# Patient Record
Sex: Male | Born: 1990 | Race: White | Hispanic: No | Marital: Married | Smoking: Former smoker
Health system: Southern US, Community
[De-identification: ages and names within clinical notes are randomized; demographics above are authoritative.]

## PROBLEM LIST (undated history)

## (undated) DIAGNOSIS — I10 Essential (primary) hypertension: Secondary | ICD-10-CM

## (undated) DIAGNOSIS — E785 Hyperlipidemia, unspecified: Secondary | ICD-10-CM

## (undated) HISTORY — DX: Hyperlipidemia, unspecified: E78.5

---
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
## 2008-03-21 ENCOUNTER — Emergency Department (HOSPITAL_COMMUNITY): Admission: EM | Admit: 2008-03-21 | Discharge: 2008-03-21 | Payer: Self-pay | Admitting: Emergency Medicine

## 2008-05-10 ENCOUNTER — Emergency Department (HOSPITAL_COMMUNITY): Admission: EM | Admit: 2008-05-10 | Discharge: 2008-05-10 | Payer: Self-pay | Admitting: Emergency Medicine

## 2008-05-10 ENCOUNTER — Encounter: Payer: Self-pay | Admitting: Orthopedic Surgery

## 2008-05-13 ENCOUNTER — Ambulatory Visit: Payer: Self-pay | Admitting: Orthopedic Surgery

## 2008-05-13 DIAGNOSIS — S52209A Unspecified fracture of shaft of unspecified ulna, initial encounter for closed fracture: Secondary | ICD-10-CM | POA: Insufficient documentation

## 2008-05-27 ENCOUNTER — Emergency Department (HOSPITAL_COMMUNITY): Admission: EM | Admit: 2008-05-27 | Discharge: 2008-05-27 | Payer: Self-pay | Admitting: Emergency Medicine

## 2008-06-03 ENCOUNTER — Ambulatory Visit: Payer: Self-pay | Admitting: Orthopedic Surgery

## 2008-06-19 ENCOUNTER — Telehealth: Payer: Self-pay | Admitting: Orthopedic Surgery

## 2008-06-22 ENCOUNTER — Ambulatory Visit: Payer: Self-pay | Admitting: Orthopedic Surgery

## 2008-07-01 ENCOUNTER — Telehealth: Payer: Self-pay | Admitting: Orthopedic Surgery

## 2008-07-16 ENCOUNTER — Ambulatory Visit: Payer: Self-pay | Admitting: Orthopedic Surgery

## 2008-07-16 DIAGNOSIS — M25519 Pain in unspecified shoulder: Secondary | ICD-10-CM | POA: Insufficient documentation

## 2008-07-30 ENCOUNTER — Ambulatory Visit: Payer: Self-pay | Admitting: Orthopedic Surgery

## 2008-10-06 ENCOUNTER — Emergency Department (HOSPITAL_COMMUNITY): Admission: EM | Admit: 2008-10-06 | Discharge: 2008-10-06 | Payer: Self-pay | Admitting: Emergency Medicine

## 2008-10-07 ENCOUNTER — Emergency Department (HOSPITAL_COMMUNITY): Admission: EM | Admit: 2008-10-07 | Discharge: 2008-10-07 | Payer: Self-pay | Admitting: Emergency Medicine

## 2008-10-09 ENCOUNTER — Encounter (HOSPITAL_COMMUNITY): Admission: RE | Admit: 2008-10-09 | Discharge: 2008-11-08 | Payer: Self-pay | Admitting: Emergency Medicine

## 2010-05-23 ENCOUNTER — Emergency Department (HOSPITAL_COMMUNITY): Admission: EM | Admit: 2010-05-23 | Discharge: 2010-05-23 | Payer: Self-pay | Admitting: Emergency Medicine

## 2010-07-06 ENCOUNTER — Emergency Department (HOSPITAL_COMMUNITY): Admission: EM | Admit: 2010-07-06 | Discharge: 2010-07-06 | Payer: Self-pay | Admitting: Emergency Medicine

## 2010-08-03 ENCOUNTER — Emergency Department (HOSPITAL_COMMUNITY): Admission: EM | Admit: 2010-08-03 | Discharge: 2010-08-03 | Payer: Self-pay | Admitting: Emergency Medicine

## 2010-09-16 ENCOUNTER — Emergency Department (HOSPITAL_COMMUNITY): Admission: EM | Admit: 2010-09-16 | Discharge: 2010-09-16 | Payer: Self-pay | Admitting: Emergency Medicine
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
## 2017-12-07 ENCOUNTER — Emergency Department (HOSPITAL_COMMUNITY)
Admission: EM | Admit: 2017-12-07 | Discharge: 2017-12-07 | Disposition: A | Discharge status: Home / Self Care | Payer: Self-pay | Attending: Emergency Medicine | Admitting: Emergency Medicine

## 2017-12-07 ENCOUNTER — Other Ambulatory Visit: Payer: Self-pay

## 2017-12-07 ENCOUNTER — Encounter (HOSPITAL_COMMUNITY): Payer: Self-pay | Admitting: Emergency Medicine

## 2017-12-07 DIAGNOSIS — X58XXXA Exposure to other specified factors, initial encounter: Secondary | ICD-10-CM | POA: Insufficient documentation

## 2017-12-07 DIAGNOSIS — Y999 Unspecified external cause status: Secondary | ICD-10-CM | POA: Insufficient documentation

## 2017-12-07 DIAGNOSIS — Y939 Activity, unspecified: Secondary | ICD-10-CM | POA: Insufficient documentation

## 2017-12-07 DIAGNOSIS — Y929 Unspecified place or not applicable: Secondary | ICD-10-CM | POA: Insufficient documentation

## 2017-12-07 DIAGNOSIS — T1502XA Foreign body in cornea, left eye, initial encounter: Secondary | ICD-10-CM | POA: Insufficient documentation

## 2017-12-07 DIAGNOSIS — H18892 Other specified disorders of cornea, left eye: Secondary | ICD-10-CM

## 2017-12-07 MED ORDER — ERYTHROMYCIN 5 MG/GM OP OINT
TOPICAL_OINTMENT | Freq: Once | OPHTHALMIC | Status: AC
  Administered 2017-12-07: 1 via OPHTHALMIC
  Filled 2017-12-07: qty 3.5

## 2017-12-07 MED ORDER — ERYTHROMYCIN 5 MG/GM OP OINT
TOPICAL_OINTMENT | OPHTHALMIC | 0 refills | Status: DC
Start: 2017-12-07 — End: 2023-01-05

## 2017-12-07 MED ORDER — TETRACAINE HCL 0.5 % OP SOLN
OPHTHALMIC | Status: AC
  Filled 2017-12-07: qty 4

## 2017-12-07 MED ORDER — OXYCODONE-ACETAMINOPHEN 5-325 MG PO TABS: 1.0000 | ORAL_TABLET | ORAL | 0 refills | Status: DC | PRN

## 2017-12-07 MED ORDER — FLUORESCEIN SODIUM 1 MG OP STRP
ORAL_STRIP | OPHTHALMIC | Status: AC
  Filled 2017-12-07: qty 1

## 2017-12-07 NOTE — ED Triage Notes (Signed)
Pt states he woke up tonight and felt like there was "a thorn" in my eye.

## 2017-12-07 NOTE — Discharge Instructions (Signed)
You had a piece of metal in your eye.  Unfortunately, when it was removed, some rust had bleached into your cornea.  You need to follow-up with the ophthalmologist to have the rust removed, or it will permanently stain your eye.

## 2017-12-07 NOTE — ED Notes (Signed)
Pt ambulatory to waiting room. Pt verbalized understanding of discharge instructions.   

## 2017-12-07 NOTE — ED Provider Notes (Signed)
[facility] EMERGENCY DEPARTMENT Provider Note   CSN: [id number] Arrival date & time: 12/07/17  0255     History   Chief Complaint Chief Complaint  Patient presents with  . Eye injury    HPI [name] is a 27 y.o. male.  The history is provided by the patient.  He complains of a foreign body sensation in his left eye.  He has been working doing grinding and welding.  When he stepped outside into the light, he suddenly had severe pain in his left eye as if there was a thorn in it.  He was unable to sleep because of pain.  He denies any other injury.  No past medical history on file.  Patient Active Problem List   Diagnosis Date Noted  . SHOULDER PAIN, LEFT 07/16/2008  . CLOSED FRACTURE OF SHAFT OF ULNA 05/13/2008    History reviewed. No pertinent surgical history.     Home Medications    Prior to Admission medications   Not on File    Family History No family history on file.  Social History Social History   Tobacco Use  . Smoking status: Not on file  Substance Use Topics  . Alcohol use: Not on file  . Drug use: Not on file     Allergies   Patient has no allergy information on record.   Review of Systems Review of Systems  All other systems reviewed and are negative.    Physical Exam Updated Vital Signs BP 140/90   Pulse (!) 51   Resp 18   Wt 97.5 kg (215 lb)   SpO2 99%   Physical Exam  Nursing note and vitals reviewed.  27 year old male, resting comfortably and in no acute distress. Vital signs are significant for bradycardia and borderline blood pressure. Oxygen saturation is 99%, which is normal. Head is normocephalic and atraumatic. PERRLA, EOMI. Oropharynx is clear.  Mild edema noted of the lower lid of the left eye.  There is moderate erythema of the conjunctiva of the left eye.  No foreign body seen and no conjunctival foreign body seen.  Upper and lower conjunctival sacs were inspected after eversion of lids with no foreign body  seen.  Anterior chamber appears clear. Neck is nontender and supple without adenopathy or JVD.  Back is nontender and there is no CVA tenderness. Lungs are clear without rales, wheezes, or rhonchi. Chest is nontender. Heart has regular rate and rhythm without murmur. Abdomen is soft, flat, nontender without masses or hepatosplenomegaly and peristalsis is normoactive. Extremities have no cyanosis or edema, full range of motion is present. Skin is warm and dry without rash. Neurologic: Mental status is normal, cranial nerves are intact, there are no motor or sensory deficits.  ED Treatments / Results   Procedures .Foreign Body Removal Date/Time: 12/07/2017 3:15 AM Performed by: Dione BoozeGlick, Jeslyn Amsler, MD Authorized by: Dione BoozeGlick, Mabel Roll, MD  Consent: Verbal consent obtained. Written consent not obtained. Risks and benefits: risks, benefits and alternatives were discussed Consent given by: patient Patient understanding: patient states understanding of the procedure being performed Patient consent: the patient's understanding of the procedure matches consent given Procedure consent: procedure consent matches procedure scheduled Relevant documents: relevant documents present and verified Site marked: the operative site was marked Required items: required blood products, implants, devices, and special equipment available Patient identity confirmed: verbally with patient and arm band Time out: Immediately prior to procedure a "time out" was called to verify the correct patient, procedure, equipment,  support staff and site/side marked as required. Body area: eye Location details: left cornea  Anesthesia: Local Anesthetic: tetracaine drops  Sedation: Patient sedated: no  Patient restrained: no Patient cooperative: yes Localization method: slit lamp Removal mechanism: 18 guage needle. Eye not examined with fluorescein. Residual rust ring present. Dressing: antibiotic ointment Depth:  embedded Complexity: simple 1 objects recovered. Objects recovered: piece of metal Post-procedure assessment: foreign body removed Patient tolerance: Patient tolerated the procedure well with no immediate complications    Slit lamp exam: Foreign body seen in the central cornea of the left eye.  Anterior chamber clear.  No other corneal injury seen.  Foreign body removed under direct vision with slit-lamp and residual rust ring noted.  Medications Ordered in ED Medications  fluorescein 1 MG ophthalmic strip (not administered)  tetracaine (PONTOCAINE) 0.5 % ophthalmic solution (not administered)  erythromycin ophthalmic ointment (not administered)     Initial Impression / Assessment and Plan / ED Course  I have reviewed the triage vital signs and the nursing notes.  Foreign body the cornea of the left eye with residual rust ring present after removal of foreign body.  He is discharged with prescriptions for erythromycin ophthalmic ointment and oxycodone-acetaminophen.  Referred to ophthalmology for removal of rust ring.  Final Clinical Impressions(s) / ED Diagnoses   Final diagnoses:  Foreign body in cornea, left eye, initial encounter  Corneal rust ring of left eye    ED Discharge Orders    None       Dione Booze, MD 12/07/17 [phone]

## 2017-12-10 MED FILL — Oxycodone w/ Acetaminophen Tab 5-325 MG: ORAL | Qty: 6 | Status: AC

## 2018-05-16 ENCOUNTER — Other Ambulatory Visit: Payer: Self-pay

## 2018-05-16 ENCOUNTER — Encounter (HOSPITAL_COMMUNITY): Payer: Self-pay

## 2018-05-16 ENCOUNTER — Emergency Department (HOSPITAL_COMMUNITY): Payer: Self-pay

## 2018-05-16 ENCOUNTER — Emergency Department (HOSPITAL_COMMUNITY)
Admission: EM | Admit: 2018-05-16 | Discharge: 2018-05-16 | Disposition: A | Discharge status: Home / Self Care | Payer: Self-pay | Attending: Emergency Medicine | Admitting: Emergency Medicine

## 2018-05-16 DIAGNOSIS — M5432 Sciatica, left side: Secondary | ICD-10-CM | POA: Insufficient documentation

## 2018-05-16 DIAGNOSIS — M5489 Other dorsalgia: Secondary | ICD-10-CM

## 2018-05-16 MED ORDER — METHYLPREDNISOLONE 4 MG PO TBPK: ORAL_TABLET | ORAL | 0 refills | Status: DC

## 2018-05-16 MED ORDER — DEXAMETHASONE SODIUM PHOSPHATE 10 MG/ML IJ SOLN
10.0000 mg | Freq: Once | INTRAMUSCULAR | Status: AC
Start: 2018-05-16 — End: 2018-05-16
  Administered 2018-05-16: 10 mg via INTRAMUSCULAR
  Filled 2018-05-16: qty 1

## 2018-05-16 MED ORDER — KETOROLAC TROMETHAMINE 30 MG/ML IJ SOLN
30.0000 mg | Freq: Once | INTRAMUSCULAR | Status: AC
Start: 2018-05-16 — End: 2018-05-16
  Administered 2018-05-16: 30 mg via INTRAMUSCULAR
  Filled 2018-05-16: qty 1

## 2018-05-16 MED ORDER — METHOCARBAMOL 500 MG PO TABS: 500.0000 mg | ORAL_TABLET | Freq: Two times a day (BID) | ORAL | 0 refills | Status: DC

## 2018-05-16 MED ORDER — NAPROXEN 500 MG PO TABS: 500.0000 mg | ORAL_TABLET | Freq: Two times a day (BID) | ORAL | 0 refills | Status: DC
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
## 2018-05-16 NOTE — Discharge Instructions (Signed)
X-ray looks good today.  Your symptoms are likely due to sciatic nerve inflammation.  Please take course of steroids as directed, afterwards you may begin taking naproxen twice daily, and Tylenol as needed for breakthrough pain.  You may use Robaxin to help ease muscle spasm, this can cause drowsiness do not take before driving or going to work.  You may also use over-the-counter Salon pas lidocaine patches.  These follow-up with your primary care doctor and/or orthopedics for further evaluation if symptoms are not improving.  Take it easy over the next few days with light stretching and use the exercises provided.  Return to the emergency department if you develop significantly worsening pain, numbness or weakness in the legs, loss of bowel or bladder control, fevers or any other new or concerning symptoms.

## 2018-08-19 IMAGING — DX DG LUMBAR SPINE COMPLETE 4+V
5 series · 5 of 5 positions shown · non-contrast
Comparison: 05/09/2006

CLINICAL DATA: Low back pain with radiation into the left hip.

EXAM:
LUMBAR SPINE - COMPLETE 4+ VIEW

[l-spine ap]
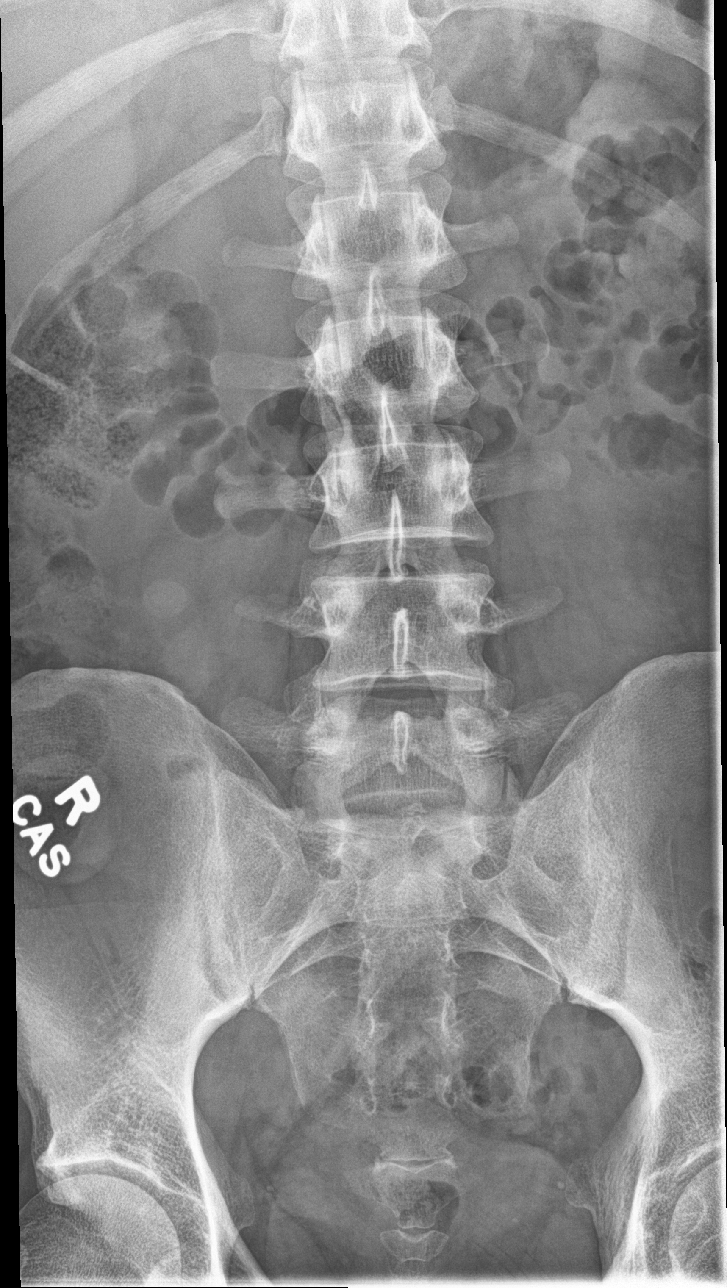

[l-spine obl (1 of 2)]
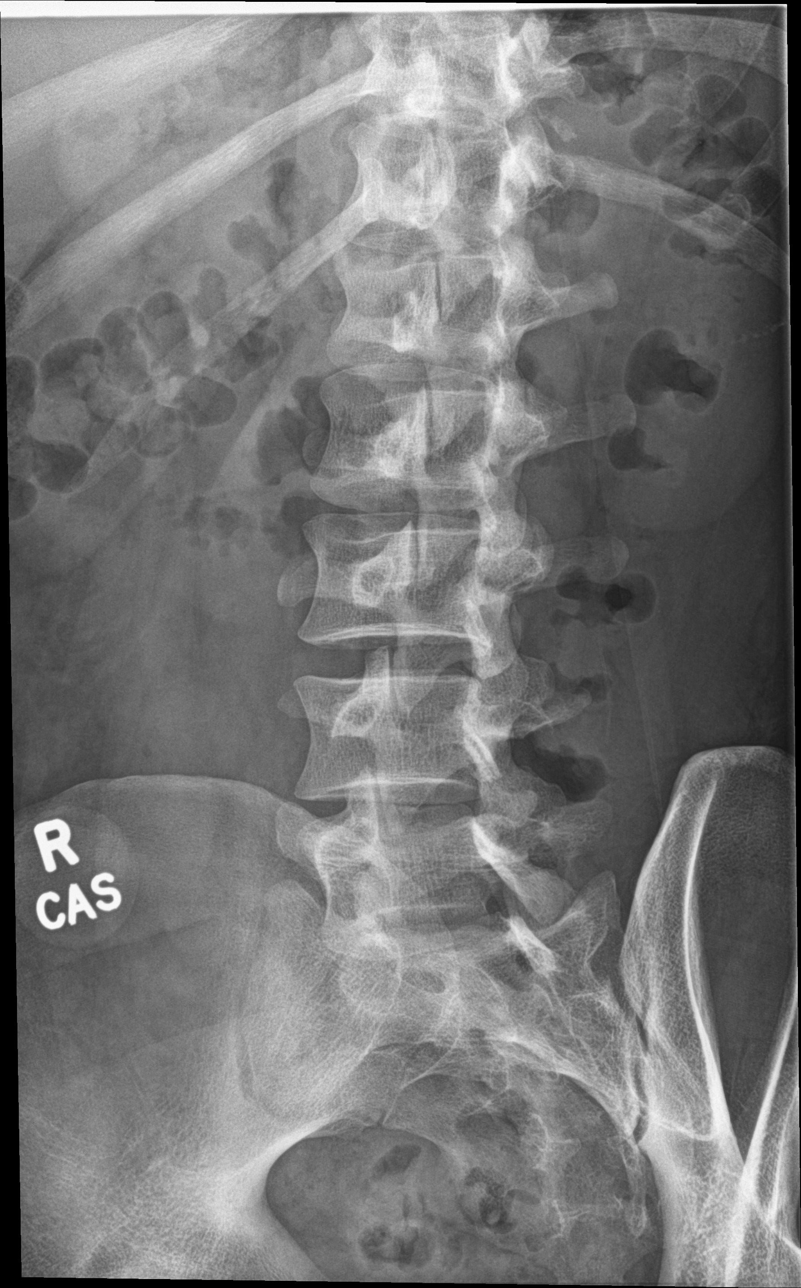

[l-spine obl (2 of 2)]
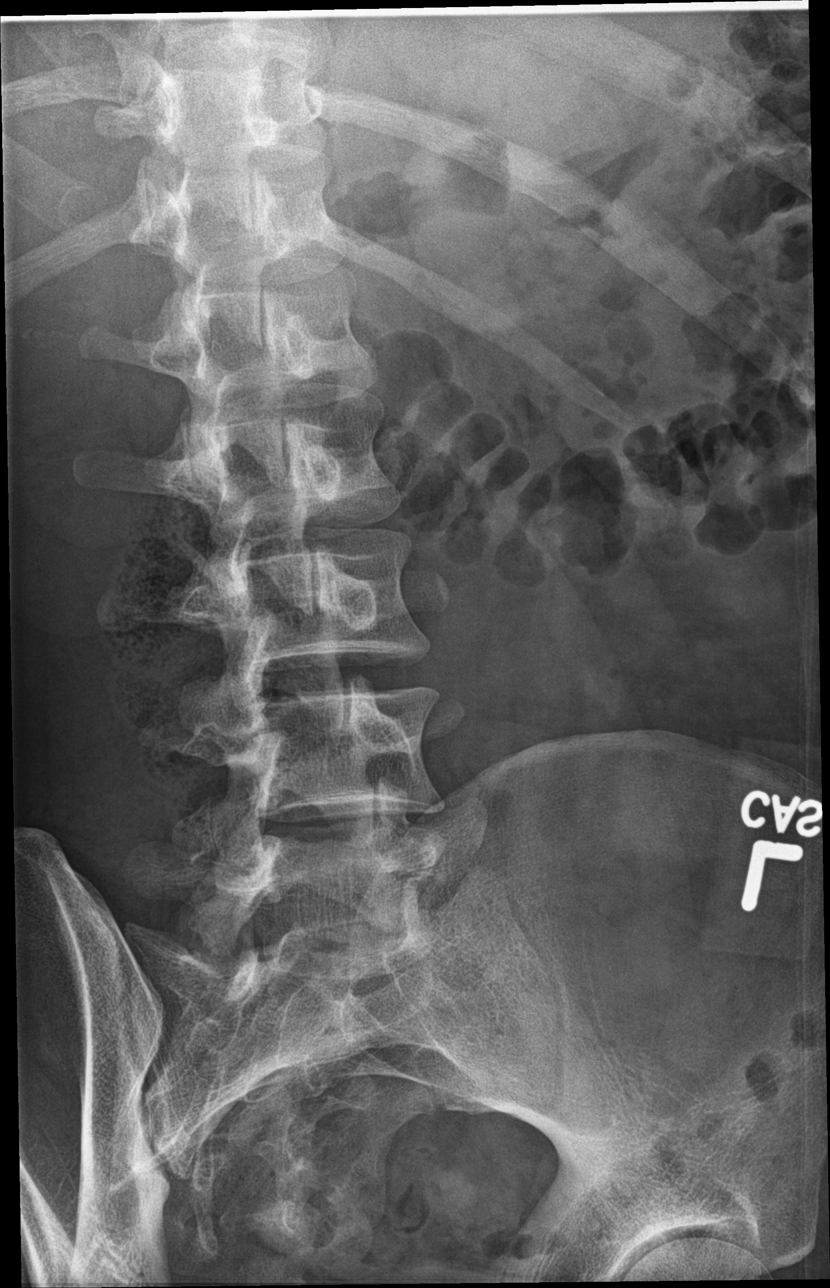

[l-spine lat]
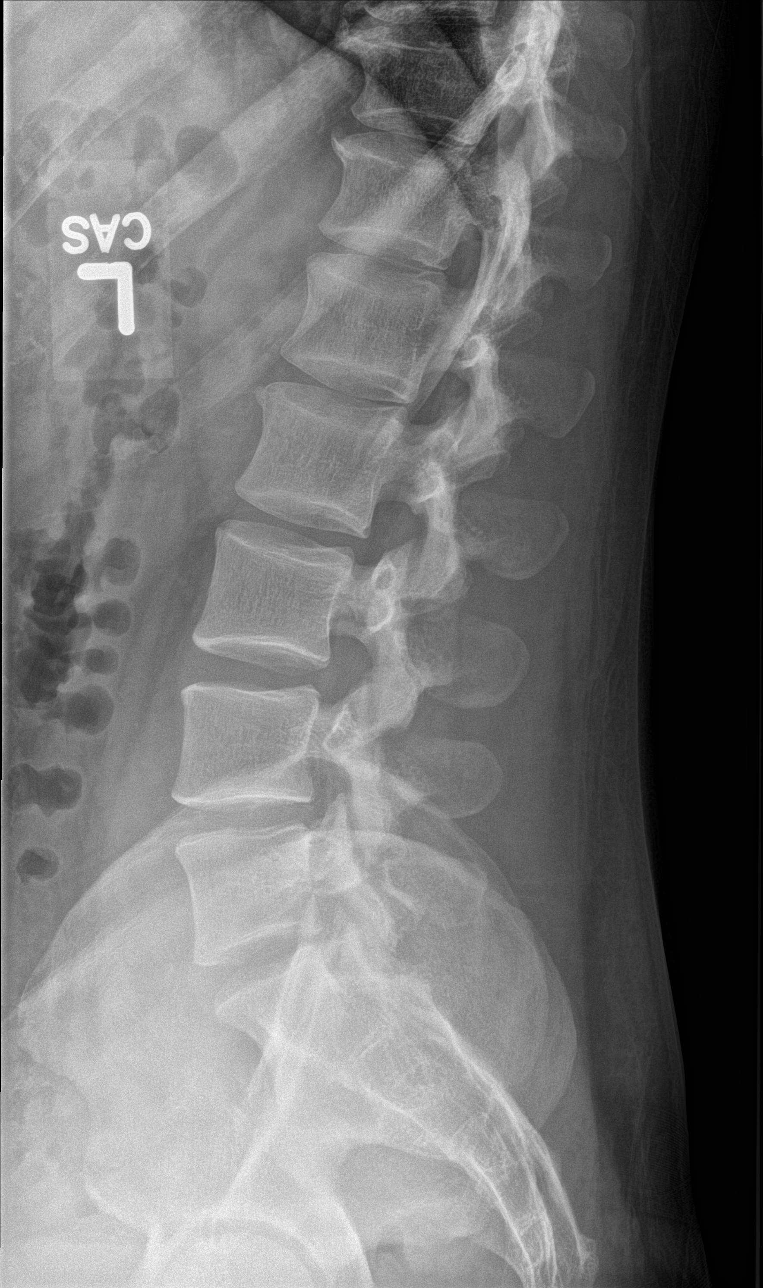

[l-spine spot]
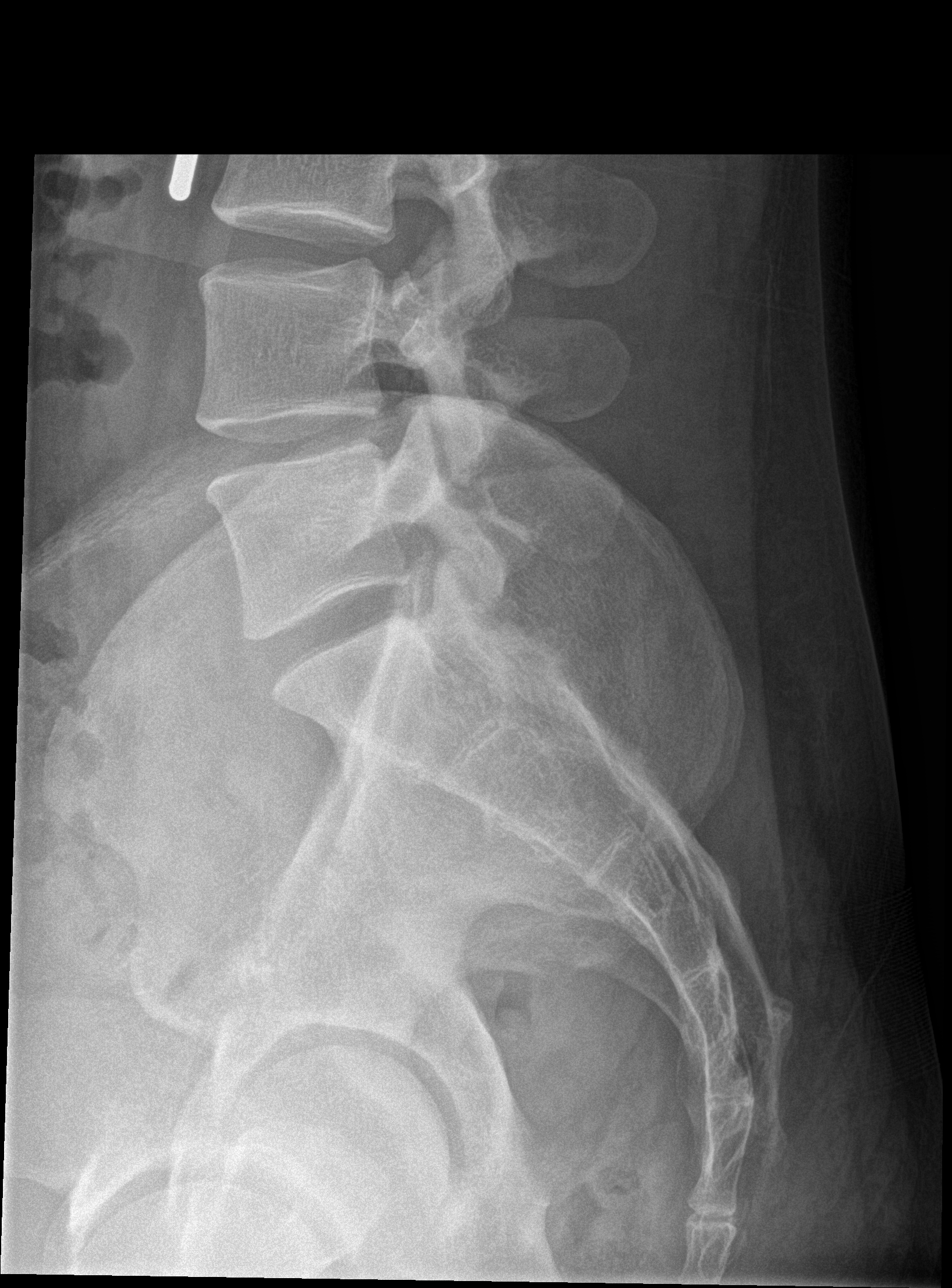

[5 of 5 positions shown; findings below may reference images not displayed]

FINDINGS: Five lumbar type vertebral bodies are well visualized. Vertebral
body height is well maintained. No pars defects are seen. No
anterolisthesis is noted. No soft tissue changes are seen.
IMPRESSION: No acute abnormality noted.

## 2022-12-22 ENCOUNTER — Other Ambulatory Visit: Payer: Self-pay

## 2022-12-22 ENCOUNTER — Emergency Department (HOSPITAL_COMMUNITY): Payer: Managed Care, Other (non HMO)

## 2022-12-22 ENCOUNTER — Emergency Department (HOSPITAL_COMMUNITY)
Admission: EM | Admit: 2022-12-22 | Discharge: 2022-12-22 | Disposition: A | Discharge status: Home / Self Care | Payer: Managed Care, Other (non HMO) | Attending: Emergency Medicine | Admitting: Emergency Medicine

## 2022-12-22 DIAGNOSIS — M778 Other enthesopathies, not elsewhere classified: Secondary | ICD-10-CM

## 2022-12-22 DIAGNOSIS — I10 Essential (primary) hypertension: Secondary | ICD-10-CM | POA: Diagnosis not present

## 2022-12-22 DIAGNOSIS — M7711 Lateral epicondylitis, right elbow: Secondary | ICD-10-CM | POA: Diagnosis not present

## 2022-12-22 DIAGNOSIS — M25521 Pain in right elbow: Secondary | ICD-10-CM | POA: Diagnosis present

## 2022-12-22 MED ORDER — DICLOFENAC SODIUM 75 MG PO TBEC
75.0000 mg | DELAYED_RELEASE_TABLET | Freq: Once | ORAL | Status: AC
  Administered 2022-12-22: 75 mg via ORAL
  Filled 2022-12-22: qty 1

## 2022-12-22 MED ORDER — DICLOFENAC SODIUM 75 MG PO TBEC: 75.0000 mg | DELAYED_RELEASE_TABLET | Freq: Two times a day (BID) | ORAL | 0 refills | Status: DC

## 2022-12-22 NOTE — Discharge Instructions (Signed)
Wear the brace on your elbow as needed for support when using.  Apply ice packs on and off to help with swelling.  Avoid excessive use at least for 1 week.  Also, your blood pressure today was elevated.  Please recheck your blood pressure at home or at the drugstore and document your readings.  I recommend that you follow-up with primary care if your blood pressure continues to remain elevated.  Return to the emergency department for any new or worsening symptoms.

## 2022-12-22 NOTE — ED Provider Notes (Signed)
[name] Note   CSN: [id number] Arrival date & time: 12/22/22  [id number]     History  Chief Complaint  Patient presents with   Elbow Pain    [name] is a 32 y.o. male.  HPI      [name] is a 32 y.o. male who presents to the Emergency Department complaining of right elbow pain gradually worsening for the last several months.  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  Feels that he has swelling of his elbow.  Pain is worse with movement and flexing of his forearm.  He occasionally has pins and needle sensation to his fingertips.  Denies any fever, chills, shoulder pain or weakness with his grip.    Home Medications Prior to Admission medications   Medication Sig Start Date End Date Taking? Authorizing Provider  diclofenac (VOLTAREN) 75 MG EC tablet Take 1 tablet (75 mg total) by mouth 2 (two) times daily. Take with food 12/22/22  Yes Demeco Ducksworth, PA-C  erythromycin ophthalmic ointment Place a 1/2 inch ribbon of ointment into the lower eyelid. [id number]   Delora Fuel, MD  methocarbamol (ROBAXIN) 500 MG tablet Take 1 tablet (500 mg total) by mouth 2 (two) times daily. 05/16/18   Jacqlyn Larsen, PA-C  methylPREDNISolone (MEDROL DOSEPAK) 4 MG TBPK tablet Take as directed 05/16/18   Jacqlyn Larsen, PA-C  oxyCODONE-acetaminophen (PERCOCET) 5-325 MG tablet Take 1 tablet by mouth every 4 (four) hours as needed for moderate pain. [id number]   Delora Fuel, MD  oxyCODONE-acetaminophen (PERCOCET) 5-325 MG tablet Take 1 tablet by mouth every 4 (four) hours as needed for moderate pain. [id number]   Delora Fuel, MD      Allergies    Patient has no known allergies.    Review of Systems   Review of Systems  Constitutional:  Negative for chills and fever.  Cardiovascular:  Negative for chest pain.  Gastrointestinal:  Negative for nausea and vomiting.  Musculoskeletal:  Positive for  arthralgias (Right elbow pain and swelling). Negative for neck pain and neck stiffness.  Skin:  Negative for color change, rash and wound.  Neurological:  Negative for weakness.    Physical Exam Updated Vital Signs BP (!) 150/109   Pulse 63   Temp 97.7 F (36.5 C)   Resp 17   SpO2 100%  Physical Exam Vitals and nursing note reviewed.  Constitutional:      General: He is not in acute distress.    Appearance: Normal appearance. He is not ill-appearing or toxic-appearing.  HENT:     Head: Atraumatic.  Cardiovascular:     Rate and Rhythm: Normal rate and regular rhythm.     Pulses: Normal pulses.  Pulmonary:     Effort: Pulmonary effort is normal.  Musculoskeletal:        General: Tenderness present. No swelling, deformity or signs of injury.     Right elbow: No swelling, deformity or effusion. Tenderness present in lateral epicondyle.     Comments: Focal tenderness to palpation over the lateral epicondyles.  I do not appreciate any bony tenderness.  Pain reproduced with supination and pronation.  No excessive warmth or erythema of the elbow.  Skin:    General: Skin is warm.     Capillary Refill: Capillary refill takes less than 2 seconds.     Findings: No bruising, erythema or rash.  Neurological:  General: No focal deficit present.     Mental Status: He is alert.     Sensory: No sensory deficit.     Motor: No weakness.     ED Results / Procedures / Treatments   Labs (all labs ordered are listed, but only abnormal results are displayed) Labs Reviewed - No data to display  EKG None  Radiology DG Elbow Complete Right  Result Date: 12/22/2022 CLINICAL DATA:  Right elbow pain for last few months denies any known injury patient also states there is fluid buildup on the right elbow. EXAM: RIGHT ELBOW - COMPLETE 3+ VIEW COMPARISON:  None Available. FINDINGS: There is no evidence of fracture or dislocation. Evaluation of radial head is suboptimal due to  technique/positioning. There is no evidence of arthropathy or other focal bone abnormality. Evaluation of soft tissues is limited on radiographs. IMPRESSION: No acute fracture or dislocation. Evaluation of radial head is suboptimal due to technique/positioning. Further evaluation with CT or MR examination is suggested if clinically warranted. Electronically Signed   By: Keane Police D.O.   On: 12/22/2022 09:30    Procedures Procedures    Medications Ordered in ED Medications  diclofenac (VOLTAREN) EC tablet 75 mg (75 mg Oral Given 12/22/22 0957)    ED Course/ Medical Decision Making/ A&P                             Medical Decision Making Patient here with persistent pain of his right elbow, worse with use.  Feels that the area is swollen.  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  Intermittent paresthesias of his fingers no shoulder pain   Clinically, suspect this is related to tendinitis.  He has focal tenderness over the lateral epicondyle.  Do not appreciate any effusion excessive warmth or erythema to suggest a septic joint.  He has full range of motion of the elbow with pain reproduced with pronation and supination.  Amount and/or Complexity of Data Reviewed Radiology: ordered.    Details: Plain film x-ray of the elbow without evidence of fracture or dislocation Discussion of management or test interpretation with external provider(s): Discussed findings with patient, suspect this is inflammatory process.  Low clinical suspicion for fracture or dislocation.  Neurovascularly intact.  No concern for septic joint.  Patient has elbow brace at home, prescription written for anti-inflammatory, agrees to RICE therapy and close outpatient follow-up with orthopedics.  His blood pressure is elevated no history of hypertension.  I have discussed this in detail with him and recommended that he establish primary care for further evaluation of his blood pressure and he agrees to  monitor his blood pressure at home.  Risk Prescription drug management.           Final Clinical Impression(s) / ED Diagnoses Final diagnoses:  Tendinitis of elbow  Hypertension, unspecified type    Rx / DC Orders ED Discharge Orders          Ordered    diclofenac (VOLTAREN) 75 MG EC tablet  2 times daily        12/22/22 [address] [place], PA-C 12/22/22 1031    Milton Ferguson, MD 12/23/22 0745

## 2022-12-22 NOTE — ED Triage Notes (Signed)
Pt here with R sided elbow pain for the last few months. Denies known injury. Pt also states there is fluid buildup on that elbow.

## 2023-01-05 ENCOUNTER — Encounter: Payer: Self-pay | Admitting: Orthopedic Surgery

## 2023-01-05 ENCOUNTER — Ambulatory Visit: Payer: Managed Care, Other (non HMO) | Admitting: Orthopedic Surgery

## 2023-01-05 VITALS — BP 141/95 | HR 58 | Ht 71.0 in | Wt 236.0 lb

## 2023-01-05 DIAGNOSIS — M7711 Lateral epicondylitis, right elbow: Secondary | ICD-10-CM

## 2023-01-05 DIAGNOSIS — M25521 Pain in right elbow: Secondary | ICD-10-CM

## 2023-01-05 MED ORDER — METHYLPREDNISOLONE ACETATE 40 MG/ML IJ SUSP
40.0000 mg | Freq: Once | INTRAMUSCULAR | Status: AC
  Administered 2023-01-05: 40 mg via INTRA_ARTICULAR

## 2023-01-05 NOTE — Patient Instructions (Addendum)
Letter for work - out for 1 week  Instructions Following Joint Injections  In clinic today, you received an injection in one of your joints (sometimes more than one).  Occasionally, you can have some pain at the injection site, this is normal.  You can place ice at the injection site, or take over-the-counter medications such as Tylenol (acetaminophen) or Advil (ibuprofen).  Please follow all directions listed on the bottle.  If your joint (knee or shoulder) becomes swollen, red or very painful, please contact the clinic for additional assistance.   Two medications were injected, including lidocaine and a steroid (often referred to as cortisone).  Lidocaine is effective almost immediately but wears off quickly.  However, the steroid can take a few days to improve your symptoms.  In some cases, it can make your pain worse for a couple of days.  Do not be concerned if this happens as it is common.  You can apply ice or take some over-the-counter medications as needed.      [facility] Elbow Rehab Do exercises exactly as told by your health care provider and adjust them as directed. It is normal to feel mild stretching, pulling, tightness, or discomfort as you do these exercises. Stop right away if you feel sudden pain or your pain gets worse.   Stretching and range-of-motion exercises These exercises warm up your muscles and joints and improve the movement andflexibility of your elbow. Wrist flexion, assisted  Straighten your left / right elbow in front of you with your palm facing down toward the floor. If told by your health care provider, bend your left / right elbow to a 90-degree angle (right angle) at your side instead of holding it straight. With your other hand, gently push over the back of your left / right hand so your fingers point toward the floor (flexion). Stop when you feel a gentle stretch on the back of your forearm. Hold this position for 10 seconds. Repeat 10 times. Complete  this exercise 1-2 times a day. Wrist extension, assisted  Straighten your left / right elbow in front of you with your palm facing up toward the ceiling. If told by your health care provider, bend your left / right elbow to a 90-degree angle (right angle) at your side instead of holding it straight. With your other hand, gently pull your left / right hand and fingers toward the floor (extension). Stop when you feel a gentle stretch on the palm side of your forearm. Hold this position for 10 seconds. Repeat 10 times. Complete this exercise 1-2 times a day. Assisted forearm rotation, supination Sit or stand with your elbows at your side. Bend your left / right elbow to a 90-degree angle (right angle). Using your uninjured hand, turn your left / right palm up toward the ceiling (supination) until you feel a gentle stretch along the inside of your forearm. Hold this position for 10 seconds. Repeat 10 times. Complete this exercise 1-2 times a day. Assisted forearm rotation, pronation Sit or stand with your elbows at your side. Bend your left / right elbow to a 90-degree angle (right angle). Using your uninjured hand, turn your left / right palm down toward the floor (pronation) until you feel a gentle stretch along the outside of your forearm. Hold this position for 10 seconds. Repeat 10 times. Complete this exercise 1-2 times a day. Strengthening exercises These exercises build strength and endurance in your forearm and elbow. Endurance is the ability to use your muscles  for a long time, even after theyget tired. Radial deviation  Stand with a 5 lbs weight or a hammer in your left / right hand. Or, sit while holding a rubber exercise band or tubing, with your left / right forearm supported on a table or countertop. Position your forearm so that the thumb is facing the ceiling, as if you are going to clap your hands. This is the neutral position. Raise your hand upward in front of you so your  thumb moves toward the ceiling (radial deviation), or pull up on the rubber tubing. Keep your forearm and elbow still while you move your wrist only. Hold this position for 10 seconds. Slowly return to the starting position. Repeat 10 times. Complete this exercise 1-2 times a day. Wrist extension, eccentric Sit with your left / right forearm palm-down and supported on a table or other surface. Let your left / right wrist extend over the edge of the surface. Hold a 5 lbs (can of soup) weight or a piece of exercise band or tubing in your left / right hand. If using a rubber exercise band or tubing, hold the other end of the tubing with your other hand. Use your uninjured hand to move your left / right hand up toward the ceiling. Take your uninjured hand away and slowly return to the starting position using only your left / right hand. Lowering your arm under tension is called eccentric extension. Repeat 10 times. Complete this exercise 1-2 times a day. Wrist extension  Do not do this exercise if it causes pain at the outside of your elbow. Only do this exercise once instructed by your health care provider. Sit with your left / right forearm supported on a table or other surface and your palm turned down toward the floor. Let your left / right wrist extend over the edge of the surface. Hold a 5 lbs weight or a piece of rubber exercise band or tubing. If you are using a rubber exercise band or tubing, hold the band or tubing in place with your other hand to provide resistance. Slowly bend your wrist so your hand moves up toward the ceiling (extension). Move only your wrist, keeping your forearm and elbow still. Hold this position for 10 seconds. Slowly return to the starting position. Repeat 10 times. Complete this exercise 1-2 times a day. Forearm rotation, supination To do this exercise, you will need a lightweight hammer or rubber mallet. Sit with your left / right forearm supported on a table  or other surface. Bend your elbow to a 90-degree angle (right angle). Position your forearm so that your palm is facing down toward the floor, with your hand resting over the edge of the table. Hold a hammer in your left / right hand. To make this exercise easier, hold the hammer near the head of the hammer. To make this exercise harder, hold the hammer near the end of the handle. Without moving your wrist or elbow, slowly rotate your forearm so your palm faces up toward the ceiling (supination). Hold this position for 10 seconds. Slowly return to the starting position. Repeat 10 times. Complete this exercise 1-2 times a day. Shoulder blade squeeze Sit in a stable chair or stand with good posture. If you are sitting down, do not let your back touch the back of the chair. Your arms should be at your sides with your elbows bent to a 90-degree angle (right angle). Position your forearms so that your thumbs are  facing the ceiling (neutral position). Without lifting your shoulders up, squeeze your shoulder blades tightly together. Hold this position for 10 seconds. Slowly release and return to the starting position. Repeat 10 times. Complete this exercise 1-2 times a day. This information is not intended to replace advice given to you by your health care provider. Make sure you discuss any questions you have with your healthcare provider. Document Revised: 12/31/2019 Document Reviewed: 12/31/2019 Elsevier Patient Education  [employer].

## 2023-01-06 ENCOUNTER — Encounter: Payer: Self-pay | Admitting: Orthopedic Surgery

## 2023-01-06 NOTE — Progress Notes (Signed)
New Patient Visit  Assessment: [name] is a 32 y.o. male with the following: 1. Right lateral epicondylitis  Plan: [name] has pain over the right lateral elbow.  This has been progressively worsening for several months.  Activities at work make his pain worse.  He has completed a course of prednisone.  He is currently wearing a sleeve on his right elbow.  He is unable to get sustained relief.  His presentation is consistent with right lateral epicondylitis.  We discussed multiple treatment options.  He elected to proceed with an injection.  He will continue to use his current brace.  I provided him with a work note, excusing him for the next week.  He will return to clinic as needed.  Procedure note injection - Right lateral elbow   Verbal consent was obtained to inject the right lateral elbow, lateral epicondyle Timeout was completed to confirm the site of injection.   The skin was prepped with alcohol and ethyl chloride was sprayed at the injection site.  A 21-gauge needle was used to inject 40 mg of Depo-Medrol and 1% lidocaine (1 cc) into the space around the lateral epicondyle using direct lateral approach.  There were no complications.  A sterile bandage was applied.    Follow-up: Return if symptoms worsen or fail to improve.  Subjective:  Chief Complaint  Patient presents with   New Patient (Initial Visit)    RT elbow pain Patient states he has hit his elbow several times. Just finished a prednisone dose pack.  LHD    History of Present Illness: [name] is a 32 y.o. male who presents for evaluation of right elbow pain.  He has had pain in the lateral elbow for couple of months.  No specific injury.  Pain continues to get worse.  He has tried medications, and a brace.  Pain gets worse at work.  He states he had similar type complaints in the left elbow, and this gradually improved.  He notes radiating pains distally from his elbow into his right  hand.   Review of Systems: No fevers or chills No numbness or tingling No chest pain No shortness of breath No bowel or bladder dysfunction No GI distress No headaches   Medical History:  History reviewed. No pertinent past medical history.  History reviewed. No pertinent surgical history.  History reviewed. No pertinent family history. Social History   Tobacco Use   Smoking status: Never   Smokeless tobacco: Never  Substance Use Topics   Alcohol use: No   Drug use: No    No Known Allergies  Current Meds  Medication Sig   diclofenac (VOLTAREN) 75 MG EC tablet Take 1 tablet (75 mg total) by mouth 2 (two) times daily. Take with food   [DISCONTINUED] erythromycin ophthalmic ointment Place a 1/2 inch ribbon of ointment into the lower eyelid.    Objective: BP (!) 141/95   Pulse (!) 58   Ht 5\' 11"  (1.803 m)   Wt 236 lb (107 kg)   BMI 32.92 kg/m   Physical Exam:  General: Alert and oriented. and No acute distress. Gait: Normal gait.  Evaluation of the right elbow demonstrates no deformity.  No redness.  No swelling.  No bruising.  He has tenderness to palpation in the area of the lateral epicondyle.  He has pain with resisted long finger extension.  He has pain with resisted extension of the right wrist.  Since the is intact distally.  All fingers are moving appropriately.  Active motion intact throughout the right hand.  IMAGING: I personally reviewed images previously obtained from the ED  X-rays were previously obtained of the right elbow.  No acute injuries are noted.  No dislocation.   New Medications:  Meds ordered this encounter  Medications   methylPREDNISolone acetate (DEPO-MEDROL) injection 40 mg      Mordecai Rasmussen, MD  01/06/2023 8:19 AM

## 2023-08-05 ENCOUNTER — Emergency Department (HOSPITAL_COMMUNITY): Payer: Managed Care, Other (non HMO)

## 2023-08-05 ENCOUNTER — Inpatient Hospital Stay (HOSPITAL_COMMUNITY)
Admission: EM | Admit: 2023-08-05 | Discharge: 2023-08-06 | DRG: 322 | Disposition: A | Discharge status: Home / Self Care | Payer: Managed Care, Other (non HMO) | Attending: Cardiology | Admitting: Cardiology

## 2023-08-05 ENCOUNTER — Other Ambulatory Visit: Payer: Self-pay

## 2023-08-05 ENCOUNTER — Encounter (HOSPITAL_COMMUNITY): Payer: Self-pay

## 2023-08-05 DIAGNOSIS — F17200 Nicotine dependence, unspecified, uncomplicated: Secondary | ICD-10-CM | POA: Diagnosis present

## 2023-08-05 DIAGNOSIS — F419 Anxiety disorder, unspecified: Secondary | ICD-10-CM | POA: Diagnosis present

## 2023-08-05 DIAGNOSIS — Z8249 Family history of ischemic heart disease and other diseases of the circulatory system: Secondary | ICD-10-CM

## 2023-08-05 DIAGNOSIS — R918 Other nonspecific abnormal finding of lung field: Secondary | ICD-10-CM | POA: Diagnosis present

## 2023-08-05 DIAGNOSIS — I214 Non-ST elevation (NSTEMI) myocardial infarction: Secondary | ICD-10-CM | POA: Diagnosis present

## 2023-08-05 DIAGNOSIS — R0683 Snoring: Secondary | ICD-10-CM | POA: Diagnosis present

## 2023-08-05 DIAGNOSIS — I10 Essential (primary) hypertension: Secondary | ICD-10-CM | POA: Diagnosis present

## 2023-08-05 DIAGNOSIS — K760 Fatty (change of) liver, not elsewhere classified: Secondary | ICD-10-CM | POA: Diagnosis present

## 2023-08-05 DIAGNOSIS — Z7982 Long term (current) use of aspirin: Secondary | ICD-10-CM

## 2023-08-05 DIAGNOSIS — Z79899 Other long term (current) drug therapy: Secondary | ICD-10-CM | POA: Diagnosis not present

## 2023-08-05 DIAGNOSIS — Z951 Presence of aortocoronary bypass graft: Secondary | ICD-10-CM

## 2023-08-05 DIAGNOSIS — R079 Chest pain, unspecified: Secondary | ICD-10-CM | POA: Diagnosis present

## 2023-08-05 DIAGNOSIS — I2510 Atherosclerotic heart disease of native coronary artery without angina pectoris: Secondary | ICD-10-CM | POA: Diagnosis present

## 2023-08-05 DIAGNOSIS — K219 Gastro-esophageal reflux disease without esophagitis: Secondary | ICD-10-CM | POA: Diagnosis present

## 2023-08-05 DIAGNOSIS — R160 Hepatomegaly, not elsewhere classified: Secondary | ICD-10-CM | POA: Diagnosis present

## 2023-08-05 DIAGNOSIS — I219 Acute myocardial infarction, unspecified: Secondary | ICD-10-CM

## 2023-08-05 DIAGNOSIS — E785 Hyperlipidemia, unspecified: Secondary | ICD-10-CM | POA: Diagnosis present

## 2023-08-05 DIAGNOSIS — E7800 Pure hypercholesterolemia, unspecified: Secondary | ICD-10-CM | POA: Diagnosis present

## 2023-08-05 DIAGNOSIS — Z955 Presence of coronary angioplasty implant and graft: Secondary | ICD-10-CM

## 2023-08-05 HISTORY — DX: Acute myocardial infarction, unspecified: I21.9

## 2023-08-05 HISTORY — DX: Essential (primary) hypertension: I10

## 2023-08-05 LAB — RAPID URINE DRUG SCREEN, HOSP PERFORMED
Amphetamines: NOT DETECTED
Barbiturates: NOT DETECTED
Benzodiazepines: NOT DETECTED
Cocaine: NOT DETECTED
Opiates: NOT DETECTED
Tetrahydrocannabinol: POSITIVE — AB

## 2023-08-05 LAB — BASIC METABOLIC PANEL
Anion gap: 9 (ref 5–15)
BUN: 11 mg/dL (ref 6–20)
CO2: 26 mmol/L (ref 22–32)
Calcium: 9 mg/dL (ref 8.9–10.3)
Chloride: 102 mmol/L (ref 98–111)
Creatinine, Ser: 1.25 mg/dL — ABNORMAL HIGH (ref 0.61–1.24)
GFR, Estimated: >60 mL/min (ref >60)
Glucose, Bld: 124 mg/dL — ABNORMAL HIGH (ref 70–99)
Potassium: 3.5 mmol/L (ref 3.5–5.1)
Sodium: 137 mmol/L (ref 135–145)

## 2023-08-05 LAB — CBC
HCT: 44.6 % (ref 39.0–52.0)
Hemoglobin: 15.5 g/dL (ref 13.0–17.0)
MCH: 33.3 pg (ref 26.0–34.0)
MCHC: 34.8 g/dL (ref 30.0–36.0)
MCV: 95.7 fL (ref 80.0–100.0)
Platelets: 261 10*3/uL (ref 150–400)
RBC: 4.66 MIL/uL (ref 4.22–5.81)
RDW: 13.1 % (ref 11.5–15.5)
WBC: 9.3 10*3/uL (ref 4.0–10.5)
nRBC: 0 % (ref 0.0–0.2)

## 2023-08-05 LAB — TROPONIN I (HIGH SENSITIVITY)
Troponin I (High Sensitivity): 6072 ng/L (ref <18)
Troponin I (High Sensitivity): 5899 ng/L (ref <18)
Troponin I (High Sensitivity): 4384 ng/L (ref <18)

## 2023-08-05 LAB — CK: Total CK: 888 U/L — ABNORMAL HIGH (ref 49–397)

## 2023-08-05 MED ORDER — ASPIRIN 325 MG PO TABS
325.0000 mg | ORAL_TABLET | Freq: Once | ORAL | Status: AC
  Administered 2023-08-05: 325 mg via ORAL
  Filled 2023-08-05: qty 1

## 2023-08-05 MED ORDER — PANTOPRAZOLE SODIUM 40 MG PO TBEC
40.0000 mg | DELAYED_RELEASE_TABLET | Freq: Every day | ORAL | Status: DC
  Administered 2023-08-06: 40 mg via ORAL
  Filled 2023-08-05: qty 1

## 2023-08-05 MED ORDER — ACETAMINOPHEN 650 MG RE SUPP: 650.0000 mg | Freq: Four times a day (QID) | RECTAL | Status: DC | PRN

## 2023-08-05 MED ORDER — POLYETHYLENE GLYCOL 3350 17 G PO PACK: 17.0000 g | PACK | Freq: Every day | ORAL | Status: DC | PRN

## 2023-08-05 MED ORDER — OMEPRAZOLE MAGNESIUM 20 MG PO TBEC: 20.0000 mg | DELAYED_RELEASE_TABLET | Freq: Every day | ORAL | Status: DC

## 2023-08-05 MED ORDER — HYDRALAZINE HCL 20 MG/ML IJ SOLN
10.0000 mg | INTRAMUSCULAR | Status: DC | PRN
  Administered 2023-08-06: 10 mg via INTRAVENOUS
  Filled 2023-08-05: qty 1

## 2023-08-05 MED ORDER — ENOXAPARIN SODIUM 40 MG/0.4ML IJ SOSY
40.0000 mg | PREFILLED_SYRINGE | INTRAMUSCULAR | Status: DC
  Administered 2023-08-06: 40 mg via SUBCUTANEOUS
  Filled 2023-08-05: qty 0.4

## 2023-08-05 MED ORDER — NITROGLYCERIN 0.4 MG SL SUBL: 0.4000 mg | SUBLINGUAL_TABLET | SUBLINGUAL | Status: DC | PRN

## 2023-08-05 MED ORDER — AMLODIPINE BESYLATE 5 MG PO TABS
5.0000 mg | ORAL_TABLET | Freq: Every day | ORAL | Status: DC
  Administered 2023-08-05 – 2023-08-06 (×2): 5 mg via ORAL
  Filled 2023-08-05 (×2): qty 1

## 2023-08-05 MED ORDER — ACETAMINOPHEN 325 MG PO TABS: 650.0000 mg | ORAL_TABLET | Freq: Four times a day (QID) | ORAL | Status: DC | PRN

## 2023-08-05 MED ORDER — IOHEXOL 350 MG/ML SOLN
100.0000 mL | Freq: Once | INTRAVENOUS | Status: AC | PRN
  Administered 2023-08-05: 100 mL via INTRAVENOUS

## 2023-08-05 MED ORDER — HYDRALAZINE HCL 20 MG/ML IJ SOLN: 10.0000 mg | INTRAMUSCULAR | Status: DC | PRN

## 2023-08-05 MED ORDER — ASPIRIN 81 MG PO TBEC
81.0000 mg | DELAYED_RELEASE_TABLET | Freq: Every day | ORAL | Status: DC
  Administered 2023-08-06: 81 mg via ORAL
  Filled 2023-08-05: qty 1

## 2023-08-05 MED ORDER — ACETAMINOPHEN 500 MG PO TABS
1000.0000 mg | ORAL_TABLET | Freq: Once | ORAL | Status: DC
  Filled 2023-08-05: qty 2

## 2023-08-05 MED ORDER — NITROGLYCERIN 0.4 MG SL SUBL
0.4000 mg | SUBLINGUAL_TABLET | SUBLINGUAL | Status: DC | PRN
  Administered 2023-08-05: 0.4 mg via SUBLINGUAL
  Filled 2023-08-05 (×2): qty 1

## 2023-08-05 NOTE — ED Notes (Signed)
pts BP cycling high, last reading was 160/116-- PA-C made aware

## 2023-08-05 NOTE — ED Notes (Signed)
Placed on cardiac monitoring

## 2023-08-05 NOTE — ED Notes (Signed)
MD at bedside. 

## 2023-08-05 NOTE — ED Notes (Signed)
Hospitalist present at the bedside

## 2023-08-05 NOTE — ED Notes (Signed)
Paged cards
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
## 2023-08-05 NOTE — ED Notes (Signed)
BP still cycling high, last BP reading was 160/120-- MD made aware
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
## 2023-08-05 NOTE — Assessment & Plan Note (Addendum)
Blood pressure elevated systolic 140s to 454U. -With heart rates mostly in the 60s, and likely contrast exposure, will start Norvasc 5mg  daily -As needed hydralazine 10 mg for systolic greater than 160

## 2023-08-05 NOTE — ED Notes (Signed)
Patient transported to CT 

## 2023-08-05 NOTE — ED Notes (Signed)
BP 151/121--PA-C made aware

## 2023-08-05 NOTE — ED Notes (Signed)
Informed pt of need for urine specimen.
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
## 2023-08-05 NOTE — ED Notes (Addendum)
PA-C states pt is willing to be transported to [facility] after speaking with him about the risks of leaving. Wife at bedside with pt at this time

## 2023-08-05 NOTE — ED Notes (Signed)
Pt back from CT

## 2023-08-05 NOTE — ED Notes (Signed)
EDP present

## 2023-08-05 NOTE — ED Notes (Addendum)
Pt states that he wants to leave, states "dont want to pay for the ambulance drive to [facility] and if I need to drive to [facility] ED then I will"--PA-C and MD made aware. PA-C at bedside talking with pt

## 2023-08-05 NOTE — ED Notes (Signed)
Repeat EKG given to MD

## 2023-08-05 NOTE — ED Notes (Signed)
EDP at [facility]

## 2023-08-06 ENCOUNTER — Encounter (HOSPITAL_COMMUNITY)
Admission: EM | Disposition: A | Discharge status: Home / Self Care | Payer: Self-pay | Source: Home / Self Care | Attending: Internal Medicine

## 2023-08-06 ENCOUNTER — Other Ambulatory Visit (HOSPITAL_COMMUNITY): Payer: Self-pay

## 2023-08-06 ENCOUNTER — Encounter (HOSPITAL_COMMUNITY): Payer: Self-pay | Admitting: Internal Medicine

## 2023-08-06 DIAGNOSIS — K760 Fatty (change of) liver, not elsewhere classified: Secondary | ICD-10-CM

## 2023-08-06 DIAGNOSIS — I2510 Atherosclerotic heart disease of native coronary artery without angina pectoris: Secondary | ICD-10-CM | POA: Diagnosis not present

## 2023-08-06 DIAGNOSIS — E785 Hyperlipidemia, unspecified: Secondary | ICD-10-CM | POA: Diagnosis present

## 2023-08-06 DIAGNOSIS — I214 Non-ST elevation (NSTEMI) myocardial infarction: Secondary | ICD-10-CM | POA: Diagnosis not present

## 2023-08-06 HISTORY — PX: LEFT HEART CATH AND CORONARY ANGIOGRAPHY: CATH118249

## 2023-08-06 HISTORY — PX: CORONARY STENT INTERVENTION: CATH118234

## 2023-08-06 LAB — BASIC METABOLIC PANEL
Anion gap: 9 (ref 5–15)
BUN: 10 mg/dL (ref 6–20)
CO2: 23 mmol/L (ref 22–32)
Calcium: 8.8 mg/dL — ABNORMAL LOW (ref 8.9–10.3)
Chloride: 108 mmol/L (ref 98–111)
Creatinine, Ser: 1.01 mg/dL (ref 0.61–1.24)
GFR, Estimated: >60 mL/min (ref >60)
Glucose, Bld: 114 mg/dL — ABNORMAL HIGH (ref 70–99)
Potassium: 3.8 mmol/L (ref 3.5–5.1)
Sodium: 140 mmol/L (ref 135–145)

## 2023-08-06 LAB — CBC
HCT: 43.3 % (ref 39.0–52.0)
Hemoglobin: 15.3 g/dL (ref 13.0–17.0)
MCH: 33.5 pg (ref 26.0–34.0)
MCHC: 35.3 g/dL (ref 30.0–36.0)
MCV: 94.7 fL (ref 80.0–100.0)
Platelets: 246 10*3/uL (ref 150–400)
RBC: 4.57 MIL/uL (ref 4.22–5.81)
RDW: 13.1 % (ref 11.5–15.5)
WBC: 7.6 10*3/uL (ref 4.0–10.5)
nRBC: 0 % (ref 0.0–0.2)

## 2023-08-06 LAB — LIPID PANEL
Cholesterol: 227 mg/dL — ABNORMAL HIGH (ref 0–200)
HDL: 38 mg/dL — ABNORMAL LOW (ref >40)
LDL Cholesterol: 144 mg/dL — ABNORMAL HIGH (ref 0–99)
Total CHOL/HDL Ratio: 6 {ratio}
Triglycerides: 223 mg/dL — ABNORMAL HIGH (ref <150)
VLDL: 45 mg/dL — ABNORMAL HIGH (ref 0–40)

## 2023-08-06 LAB — HIV ANTIBODY (ROUTINE TESTING W REFLEX): HIV Screen 4th Generation wRfx: NONREACTIVE

## 2023-08-06 LAB — POCT ACTIVATED CLOTTING TIME: Activated Clotting Time: 244 s

## 2023-08-06 SURGERY — LEFT HEART CATH AND CORONARY ANGIOGRAPHY
Anesthesia: LOCAL

## 2023-08-06 MED ORDER — NITROGLYCERIN IN D5W 200-5 MCG/ML-% IV SOLN
INTRAVENOUS | Status: AC
  Filled 2023-08-06: qty 250

## 2023-08-06 MED ORDER — LABETALOL HCL 5 MG/ML IV SOLN: 10.0000 mg | INTRAVENOUS | Status: DC | PRN

## 2023-08-06 MED ORDER — SODIUM CHLORIDE 0.9 % WEIGHT BASED INFUSION: 1.0000 mL/kg/h | INTRAVENOUS | Status: DC

## 2023-08-06 MED ORDER — FENTANYL CITRATE (PF) 100 MCG/2ML IJ SOLN
INTRAMUSCULAR | Status: AC
  Filled 2023-08-06: qty 2

## 2023-08-06 MED ORDER — LOSARTAN POTASSIUM 50 MG PO TABS
50.0000 mg | ORAL_TABLET | Freq: Every day | ORAL | 2 refills | Status: DC
  Filled 2023-08-06: qty 30, 30d supply, fill #0

## 2023-08-06 MED ORDER — DULOXETINE HCL 30 MG PO CPEP: 30.0000 mg | ORAL_CAPSULE | Freq: Two times a day (BID) | ORAL | 1 refills | Status: DC

## 2023-08-06 MED ORDER — LIDOCAINE HCL (PF) 1 % IJ SOLN
INTRAMUSCULAR | Status: DC | PRN
  Administered 2023-08-06: 5 mL

## 2023-08-06 MED ORDER — SODIUM CHLORIDE 0.9 % WEIGHT BASED INFUSION
3.0000 mL/kg/h | INTRAVENOUS | Status: AC
  Administered 2023-08-06 (×2): 3 mL/kg/h via INTRAVENOUS

## 2023-08-06 MED ORDER — ASPIRIN 81 MG PO CHEW
81.0000 mg | CHEWABLE_TABLET | Freq: Every day | ORAL | 0 refills | Status: DC
  Filled 2023-08-06: qty 30, 30d supply, fill #0

## 2023-08-06 MED ORDER — ONDANSETRON HCL 4 MG/2ML IJ SOLN: 4.0000 mg | Freq: Four times a day (QID) | INTRAMUSCULAR | Status: DC | PRN

## 2023-08-06 MED ORDER — NITROGLYCERIN 0.4 MG SL SUBL
0.4000 mg | SUBLINGUAL_TABLET | SUBLINGUAL | 3 refills | Status: DC | PRN
  Filled 2023-08-06: qty 25, 1d supply, fill #0

## 2023-08-06 MED ORDER — TICAGRELOR 90 MG PO TABS
90.0000 mg | ORAL_TABLET | Freq: Two times a day (BID) | ORAL | 3 refills | Status: DC
  Filled 2023-08-06: qty 60, 30d supply, fill #0

## 2023-08-06 MED ORDER — LABETALOL HCL 5 MG/ML IV SOLN
INTRAVENOUS | Status: AC
  Filled 2023-08-06: qty 4

## 2023-08-06 MED ORDER — SODIUM CHLORIDE 0.9 % WEIGHT BASED INFUSION: 3.0000 mL/kg/h | INTRAVENOUS | Status: DC

## 2023-08-06 MED ORDER — ACETAMINOPHEN 325 MG PO TABS: 650.0000 mg | ORAL_TABLET | ORAL | Status: DC | PRN

## 2023-08-06 MED ORDER — MIDAZOLAM HCL 2 MG/2ML IJ SOLN
INTRAMUSCULAR | Status: DC | PRN
  Administered 2023-08-06: 2 mg via INTRAVENOUS

## 2023-08-06 MED ORDER — TICAGRELOR 90 MG PO TABS
ORAL_TABLET | ORAL | Status: DC | PRN
  Administered 2023-08-06: 180 mg via ORAL

## 2023-08-06 MED ORDER — LOSARTAN POTASSIUM 50 MG PO TABS
50.0000 mg | ORAL_TABLET | Freq: Every day | ORAL | 3 refills | Status: DC
  Filled 2023-08-06: qty 90, 90d supply, fill #0

## 2023-08-06 MED ORDER — ASPIRIN 81 MG PO CHEW: 81.0000 mg | CHEWABLE_TABLET | ORAL | Status: DC

## 2023-08-06 MED ORDER — IOHEXOL 350 MG/ML SOLN
INTRAVENOUS | Status: DC | PRN
  Administered 2023-08-06: 83 mL

## 2023-08-06 MED ORDER — AMLODIPINE BESYLATE 10 MG PO TABS
5.0000 mg | ORAL_TABLET | Freq: Every day | ORAL | 1 refills | Status: DC
Start: 2023-08-07 — End: 2023-08-06
  Filled 2023-08-06: qty 15, 30d supply, fill #0

## 2023-08-06 MED ORDER — HEPARIN (PORCINE) IN NACL 1000-0.9 UT/500ML-% IV SOLN
INTRAVENOUS | Status: DC | PRN
  Administered 2023-08-06: 1000 mL

## 2023-08-06 MED ORDER — ATORVASTATIN CALCIUM 40 MG PO TABS
80.0000 mg | ORAL_TABLET | Freq: Every day | ORAL | Status: DC
  Administered 2023-08-06: 80 mg via ORAL
  Filled 2023-08-06: qty 2

## 2023-08-06 MED ORDER — AMLODIPINE BESYLATE 5 MG PO TABS
5.0000 mg | ORAL_TABLET | Freq: Every day | ORAL | 3 refills | Status: DC
  Filled 2023-08-06: qty 30, 30d supply, fill #0

## 2023-08-06 MED ORDER — VERAPAMIL HCL 2.5 MG/ML IV SOLN
INTRAVENOUS | Status: AC
  Filled 2023-08-06: qty 2

## 2023-08-06 MED ORDER — FENTANYL CITRATE (PF) 100 MCG/2ML IJ SOLN
INTRAMUSCULAR | Status: DC | PRN
  Administered 2023-08-06: 50 ug via INTRAVENOUS

## 2023-08-06 MED ORDER — VERAPAMIL HCL 2.5 MG/ML IV SOLN: INTRAVENOUS | Status: DC | PRN

## 2023-08-06 MED ORDER — MIDAZOLAM HCL 2 MG/2ML IJ SOLN
INTRAMUSCULAR | Status: AC
  Filled 2023-08-06: qty 2

## 2023-08-06 MED ORDER — TICAGRELOR 90 MG PO TABS
90.0000 mg | ORAL_TABLET | Freq: Two times a day (BID) | ORAL | 0 refills | Status: DC
  Filled 2023-08-06: qty 60, 30d supply, fill #0

## 2023-08-06 MED ORDER — LABETALOL HCL 5 MG/ML IV SOLN
INTRAVENOUS | Status: DC | PRN
  Administered 2023-08-06: 10 mg via INTRAVENOUS

## 2023-08-06 MED ORDER — ASPIRIN 81 MG PO TBEC
81.0000 mg | DELAYED_RELEASE_TABLET | Freq: Every day | ORAL | 12 refills | Status: DC
  Filled 2023-08-06: qty 30, 30d supply, fill #0

## 2023-08-06 MED ORDER — ATORVASTATIN CALCIUM 80 MG PO TABS
80.0000 mg | ORAL_TABLET | Freq: Every day | ORAL | 3 refills | Status: DC
  Filled 2023-08-06: qty 30, 30d supply, fill #0

## 2023-08-06 MED ORDER — SODIUM CHLORIDE 0.9% FLUSH: 3.0000 mL | Freq: Two times a day (BID) | INTRAVENOUS | Status: DC

## 2023-08-06 MED ORDER — PANTOPRAZOLE SODIUM 40 MG PO TBEC
40.0000 mg | DELAYED_RELEASE_TABLET | Freq: Every day | ORAL | 3 refills | Status: DC
  Filled 2023-08-06: qty 90, 90d supply, fill #0

## 2023-08-06 MED ORDER — TICAGRELOR 90 MG PO TABS: 90.0000 mg | ORAL_TABLET | Freq: Two times a day (BID) | ORAL | Status: DC

## 2023-08-06 MED ORDER — HEPARIN SODIUM (PORCINE) 1000 UNIT/ML IJ SOLN
INTRAMUSCULAR | Status: AC
  Filled 2023-08-06: qty 10

## 2023-08-06 MED ORDER — LIDOCAINE HCL (PF) 1 % IJ SOLN
INTRAMUSCULAR | Status: AC
  Filled 2023-08-06: qty 30

## 2023-08-06 MED ORDER — HEPARIN SODIUM (PORCINE) 1000 UNIT/ML IJ SOLN
INTRAMUSCULAR | Status: DC | PRN
  Administered 2023-08-06: 5000 [IU] via INTRAVENOUS
  Administered 2023-08-06: 2000 [IU] via INTRAVENOUS
  Administered 2023-08-06: 5000 [IU] via INTRAVENOUS

## 2023-08-06 MED ORDER — ATORVASTATIN CALCIUM 80 MG PO TABS
80.0000 mg | ORAL_TABLET | Freq: Every day | ORAL | 2 refills | Status: DC
  Filled 2023-08-06: qty 30, 30d supply, fill #0

## 2023-08-06 MED ORDER — TICAGRELOR 90 MG PO TABS
ORAL_TABLET | ORAL | Status: AC
  Filled 2023-08-06: qty 2

## 2023-08-06 MED ORDER — SODIUM CHLORIDE 0.9% FLUSH: 3.0000 mL | INTRAVENOUS | Status: DC | PRN

## 2023-08-06 MED ORDER — NITROGLYCERIN 0.4 MG SL SUBL
0.4000 mg | SUBLINGUAL_TABLET | SUBLINGUAL | 1 refills | Status: DC | PRN
  Filled 2023-08-06: qty 25, 8d supply, fill #0

## 2023-08-06 MED ORDER — HYDRALAZINE HCL 20 MG/ML IJ SOLN: 5.0000 mg | INTRAMUSCULAR | Status: DC | PRN

## 2023-08-06 SURGICAL SUPPLY — 12 items
CATH INFINITI 5FR ANG PIGTAIL (CATHETERS)
CATH INFINITI 5FR JK (CATHETERS) ×1
CATH INFINITI JR4 5F (CATHETERS) ×1
CATH VISTA GUIDE 6FR JR4 (CATHETERS) ×1
DEVICE RAD COMP TR BAND LRG (VASCULAR PRODUCTS) ×1
GLIDESHEATH SLEND A-KIT 6F 22G (SHEATH) ×1
INQWIRE 1.5J .035X260CM (WIRE) ×1
KIT ENCORE 26 ADVANTAGE (KITS) ×1
PACK CARDIAC CATHETERIZATION (CUSTOM PROCEDURE TRAY) ×1
SET ATX-X65L (MISCELLANEOUS) ×1
SYNERGY XD 5.0X12 (Permanent Stent) ×1 IMPLANT
WIRE RUNTHROUGH .014X180CM (WIRE) ×1

## 2023-08-06 NOTE — TOC Benefit Eligibility Note (Signed)
Patient Product/process development scientist completed.    The patient is insured through [employer]. Patient has [employer], may use a copay card, and/or apply for patient assistance if available.    Ran test claim for Brilinta 90 mg and the current 30 day co-pay is $224.65 due to a $4000 deductible .   This test claim was processed through [facility]- copay amounts may vary at other pharmacies due to pharmacy/plan contracts, or as the patient moves through the different stages of their insurance plan.     Roland Earl, CPHT Pharmacy Technician III Certified Patient Advocate [facility] Patient Advocate Team Direct Number: [phone]  Fax: [phone]
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
## 2023-08-06 NOTE — ED Notes (Signed)
Pt alert, NAD, calm, interactive, resps e/u, up to BS. HR went from 77 to 107 with standing, BP remains high. Denies pain or other sx. Given PO ice water.

## 2023-08-06 NOTE — Assessment & Plan Note (Signed)
Lipid Panel     Component Value Date/Time   CHOL 227 (H) 08/06/2023 0504   TRIG 223 (H) 08/06/2023 0504   HDL 38 (L) 08/06/2023 0504   CHOLHDL 6.0 08/06/2023 0504   VLDL 45 (H) 08/06/2023 0504   LDLCALC 144 (H) 08/06/2023 0504   -Starting high-dose statins, -Checking Lipoprotein a

## 2023-08-06 NOTE — ED Notes (Signed)
t alert, NAD, calm, interactive, resps e/u, speaking in clear complete sentences. Denies pain, sob, nausea or dizziness.
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
## 2023-08-06 NOTE — Interval H&P Note (Signed)
History and Physical Interval Note:  08/06/2023 11:59 AM  [name]  has presented today for surgery, with the diagnosis of nstemi.  The various methods of treatment have been discussed with the patient and family. After consideration of risks, benefits and other options for treatment, the patient has consented to  Procedure(s): LEFT HEART CATH AND CORONARY ANGIOGRAPHY (N/A) and possible coronary intervention as a surgical intervention.  The patient's history has been reviewed, patient examined, no change in status, stable for surgery.  I have reviewed the patient's chart and labs.  Questions were answered to the patient's satisfaction.    Cath Lab Visit (complete for each Cath Lab visit)  Clinical Evaluation Leading to the Procedure:   ACS: Yes.    Non-ACS:    Anginal Classification: CCS IV  Anti-ischemic medical therapy: No Therapy  Non-Invasive Test Results: No non-invasive testing performed  Prior CABG: No previous CABG   Yates Decamp

## 2023-08-06 NOTE — ED Notes (Signed)
Cards PA at [facility]

## 2023-08-06 NOTE — Progress Notes (Signed)
CARDIAC REHAB PHASE I      Post MI/stent education including restrictions, risk factors, exercise guidelines, antiplatelet therapy importance, MI booklet, NTG use, heart healthy diet and CRP2 reviewed. All questions and concerns addressed. Will refer to AP for CRP2.  Woodroe Chen, RN BSN 08/06/2023 3:04 PM

## 2023-08-06 NOTE — Progress Notes (Signed)
TR BAND REMOVAL  LOCATION:    right radial  DEFLATED PER PROTOCOL:    Yes.    TIME BAND OFF / DRESSING APPLIED:    1550p a clean dry dressing applied with gauze and tegaderm, secured with coban   SITE UPON ARRIVAL:    Level 0  SITE AFTER BAND REMOVAL:    Level 0  CIRCULATION SENSATION AND MOVEMENT:    Within Normal Limits   Yes.    COMMENTS:   Care instructed givento patient .  Patient stated he was not going to wait until bedrest was up to leave.   Advised about the safety of staying until bedrest was up per MD order.
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
## 2023-08-06 NOTE — ED Notes (Signed)
Hospitalist at [facility]. Wife at [facility].
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
## 2023-08-06 NOTE — Discharge Instructions (Signed)

## 2023-08-07 ENCOUNTER — Encounter (HOSPITAL_COMMUNITY): Payer: Self-pay | Admitting: Cardiology

## 2023-08-07 ENCOUNTER — Other Ambulatory Visit (HOSPITAL_COMMUNITY): Payer: Self-pay

## 2023-08-08 LAB — LIPOPROTEIN A (LPA): Lipoprotein (a): <8.4 nmol/L (ref <75.0)

## 2023-08-23 ENCOUNTER — Ambulatory Visit
Admission: RE | Admit: 2023-08-23 | Discharge: 2023-08-23 | Disposition: A | Discharge status: Home / Self Care | Payer: Managed Care, Other (non HMO) | Source: Ambulatory Visit

## 2023-08-23 VITALS — BP 142/83 | HR 70 | Temp 98.5°F | Resp 18

## 2023-08-23 DIAGNOSIS — S301XXA Contusion of abdominal wall, initial encounter: Secondary | ICD-10-CM

## 2023-08-23 NOTE — Discharge Instructions (Addendum)
The bruise on your abdomen does not look concerned to me today.  You can take Tylenol for pain and apply ice 15 minutes on, 45 minutes off every hour while awake to help with pain and swelling.  Try to cushion the bruised area to help prevent further injury and help with healing.  Follow up with Cardiology as planned next week.  Since you are taking blood thinners, you need to avoid all NSAIDs.  This includes ibuprofen, Motrin, BC goody, Aleve, Advil, naproxen.

## 2023-08-23 NOTE — ED Triage Notes (Addendum)
Bruise on stomach that is spreading to center of abdomin.  States he is on blood thinner.  States he noticed the bruise on Saturday
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
## 2023-08-28 ENCOUNTER — Ambulatory Visit: Payer: Managed Care, Other (non HMO) | Attending: Nurse Practitioner | Admitting: Nurse Practitioner

## 2023-08-28 ENCOUNTER — Encounter: Payer: Self-pay | Admitting: Nurse Practitioner

## 2023-08-28 VITALS — BP 136/84 | HR 62 | Ht 71.0 in | Wt 225.2 lb

## 2023-08-28 DIAGNOSIS — I10 Essential (primary) hypertension: Secondary | ICD-10-CM | POA: Diagnosis not present

## 2023-08-28 DIAGNOSIS — I2510 Atherosclerotic heart disease of native coronary artery without angina pectoris: Secondary | ICD-10-CM

## 2023-08-28 DIAGNOSIS — I214 Non-ST elevation (NSTEMI) myocardial infarction: Secondary | ICD-10-CM | POA: Diagnosis not present

## 2023-08-28 DIAGNOSIS — T148XXA Other injury of unspecified body region, initial encounter: Secondary | ICD-10-CM

## 2023-08-28 DIAGNOSIS — F1290 Cannabis use, unspecified, uncomplicated: Secondary | ICD-10-CM

## 2023-08-28 DIAGNOSIS — E785 Hyperlipidemia, unspecified: Secondary | ICD-10-CM

## 2023-08-28 MED ORDER — BLOOD PRESSURE MONITOR DEVI: 1.0000 | Freq: Every day | 0 refills | Status: AC

## 2023-08-28 NOTE — Patient Instructions (Addendum)
Medication Instructions:  Your physician recommends that you continue on your current medications as directed. Please refer to the Current Medication list given to you today.  Labwork: In 1-2 weeks   Testing/Procedures: None   Follow-Up: Your physician recommends that you schedule a follow-up appointment in: 6-8 weeks   Any Other Special Instructions Will Be Listed Below (If Applicable).   If you need a refill on your cardiac medications before your next appointment, please call your pharmacy.

## 2023-08-28 NOTE — Progress Notes (Signed)
Cardiology Office Note:  .   Date:  08/28/2023  ID:  Arnell Sieving, DOB December 05, 1990, MRN [id number] PCP: Aviva Kluver  [facility] Providers Cardiologist:  Dina Rich, MD    History of Present Illness: .   HAMLIN DEVINE is a 32 y.o. male with a PMH of CAD, s/p NSTEMI, hypertension, GERD, hx of tobacco abuse, alcohol abuse, marijuana use, possible ? Remote mini strokes, who presents today for hospital follow-up.   Hospitalized October 2024 for chest pain.  Troponins peaked at 6,072.  Blood pressures were elevated and heart rate was reported at 30s night before presentation to hospital.  Left heart cath showed 80% stenosis in RCA that was felt to be culprit lesion, treated successfully with PCI/DES.  Also noted to have mild nonobstructive stenosis in distal RCA and distal left circumflex and was treated medically.  Was started on aspirin and Brilinta, DAPT recommended for 12 months.  ED visit on August 23, 2023 and noted bruising to his abdomen.  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  Supportive care discussed, recommended follow-up with cardiology as planned.  Today he presents for hospital follow-up.  He states he is doing well.  Does admit to some fatigue since leaving the hospital, says sometimes he finds himself catching his breath although denies any significant shortness of breath.  Says he has been drinking some caffeine as he has been reading that Brilinta can cause some shortness of breath. Denies any chest pain, palpitations, syncope, presyncope, dizziness, orthopnea, PND, swelling or significant weight changes, acute bleeding, or claudication.   ROS: Negative, See HPI.  FH: Father had bypass before age 81. SH: Works as a Psychologist, occupational.  Denies any tobacco use.  Drinks alcohol socially, smokes marijuana occasionally.  Studies Reviewed: Marland Kitchen    Left Heart Catheterization 08/06/23: Hemodynamic data: LV:  10/24/1953/-1, EDP 10 mmHg.  Ao 143/105, mean 125 mmHg.  No pressure gradient across the aortic valve.   Angiographic data: RCA: Very large caliber vessel and dominant vessel.  There is mild ectasia noted in the proximal and mid segment.  There is a focal ulcerated 80% stenosis in the proximal segment, about a 30% mid stenosis and a 30% distal stenosis.  Slow flow is evident throughout the right coronary artery. LM: Very large-caliber vessel, smooth and normal. LAD: Large caliber vessel giving origin to large D1 and several small D2 and D3 vessels, minimal disease is evident in the midsegment. LCx: Large-caliber vessel, continues as a very large OM 3 after giving origin to very small OM1 and 2.  AV groove circumflex is small.  The distal circumflex has a 30-40 % diffuse stenosis.   Intervention data: Successful direct stenting of the proximal RCA lesion with implantation of a 5.0 x 12 mm Synergy XD DES deployed at 14 atmospheric pressure, stenosis reduced from 80% to 0% with TIMI-3 to TIMI-3 flow.  Physical Exam:   VS:  BP 136/84   Pulse 62   Ht 5\' 11"  (1.803 m)   Wt 225 lb 3.2 oz (102.2 kg)   SpO2 98%   BMI 31.41 kg/m    Wt Readings from Last 3 Encounters:  08/28/23 225 lb 3.2 oz (102.2 kg)  08/05/23 225 lb (102.1 kg)  01/05/23 236 lb (107 kg)    GEN: Well nourished, well developed in no acute distress NECK: No JVD; No carotid bruits CARDIAC: S1/S2, RRR, no murmurs, rubs,  gallops RESPIRATORY:  Clear to auscultation without rales, wheezing or rhonchi  ABDOMEN: Soft, non-tender, non-distended EXTREMITIES:  No edema; No deformity   ASSESSMENT AND PLAN: .    CAD, s/p NSTEMI, bruising Stable with no anginal symptoms. No indication for ischemic evaluation.  States he is already returned to work.  He has followed right radial precautions post cath.  Right radial site is clean, dry, intact and denies any complications post cardiac catheterization.  Does have some bruising to his right side  of his mid abdomen, no hematoma noted and also to left side of abdomen, no hematoma noted.  Discussed conservative measures with him.  Continue aspirin, atorvastatin, losartan, Brilinta, and nitroglycerin as needed.  Heart healthy diet and regular cardiovascular exercise encouraged. Will obtain CBC and BMET in 1 to 2 weeks.     Cardiac Rehabilitation Eligibility Assessment  The patient is ready to start cardiac rehabilitation from a cardiac standpoint.   2. HTN Patient has a longstanding history of high blood pressure since 32 years old.  Blood pressure mildly elevated today.  Discussed SBP goal is less than 130.  Says he has used marijuana in the past to help with this. Discussed to monitor BP at home at least 2 hours after medications and sitting for 5-10 minutes.  Continue losartan and amlodipine. Will provide Rx for BP cuff. Given salty six diet sheet. Heart healthy diet and regular cardiovascular exercise encouraged.  Discussed with him to bring back BP log for evaluation at next office visit.  If SBP is not at goal consistently, plan to increase losartan to 75 mg daily.  3. HLD Started on atorvastatin around d/c. Tolerating well. At next office visit, plan to obtain FLP/LFT for futher evaluation.  Continue current medication regimen. Heart healthy diet and regular cardiovascular exercise encouraged.   4. Marijuana use Marijuana use cessation encouraged and discussed. Discussed the risks of using marijuana.     Dispo: Follow-up with me/APP in 6 to 8 weeks or sooner if anything changes.  Signed, Sharlene Dory, NP

## 2023-09-04 ENCOUNTER — Other Ambulatory Visit (HOSPITAL_COMMUNITY): Payer: Self-pay

## 2023-10-03 LAB — CBC
Hematocrit: 43.2 % (ref 37.5–51.0)
Hemoglobin: 14.3 g/dL (ref 13.0–17.7)
MCH: 32.1 pg (ref 26.6–33.0)
MCHC: 33.1 g/dL (ref 31.5–35.7)
MCV: 97 fL (ref 79–97)
Platelets: 293 10*3/uL (ref 150–450)
RBC: 4.46 x10E6/uL (ref 4.14–5.80)
RDW: 12.1 % (ref 11.6–15.4)
WBC: 8 10*3/uL (ref 3.4–10.8)

## 2023-10-03 LAB — BASIC METABOLIC PANEL
BUN/Creatinine Ratio: 15 (ref 9–20)
BUN: 14 mg/dL (ref 6–20)
CO2: 24 mmol/L (ref 20–29)
Calcium: 9.8 mg/dL (ref 8.7–10.2)
Chloride: 101 mmol/L (ref 96–106)
Creatinine, Ser: 0.95 mg/dL (ref 0.76–1.27)
Glucose: 104 mg/dL — ABNORMAL HIGH (ref 70–99)
Potassium: 4.4 mmol/L (ref 3.5–5.2)
Sodium: 140 mmol/L (ref 134–144)
eGFR: 108 mL/min/{1.73_m2} (ref >59)

## 2023-10-08 ENCOUNTER — Other Ambulatory Visit: Payer: Self-pay | Admitting: Cardiology

## 2023-10-11 ENCOUNTER — Ambulatory Visit: Payer: Managed Care, Other (non HMO) | Attending: Nurse Practitioner | Admitting: Nurse Practitioner

## 2023-10-11 ENCOUNTER — Encounter: Payer: Self-pay | Admitting: Nurse Practitioner

## 2023-10-11 VITALS — BP 143/112 | HR 86 | Ht 71.0 in | Wt 230.0 lb

## 2023-10-11 DIAGNOSIS — F1290 Cannabis use, unspecified, uncomplicated: Secondary | ICD-10-CM

## 2023-10-11 DIAGNOSIS — R002 Palpitations: Secondary | ICD-10-CM

## 2023-10-11 DIAGNOSIS — I2510 Atherosclerotic heart disease of native coronary artery without angina pectoris: Secondary | ICD-10-CM | POA: Diagnosis not present

## 2023-10-11 DIAGNOSIS — I10 Essential (primary) hypertension: Secondary | ICD-10-CM

## 2023-10-11 DIAGNOSIS — E785 Hyperlipidemia, unspecified: Secondary | ICD-10-CM

## 2023-10-11 DIAGNOSIS — R0789 Other chest pain: Secondary | ICD-10-CM

## 2023-10-11 NOTE — Patient Instructions (Addendum)
Medication Instructions:  Your physician recommends that you continue on your current medications as directed. Please refer to the Current Medication list given to you today.  Labwork: In 3 weeks at [facility]   Testing/Procedures: None   Follow-Up: Your physician recommends that you schedule a follow-up appointment in:  2 week BP check please take medications 2 hours before your nurse visit  4-6 week Follow up  Any Other Special Instructions Will Be Listed Below (If Applicable). Please Purchase an OMRON ELECTRIC BLOOD PRESSURE CUFF at your local pharmacy   If you need a refill on your cardiac medications before your next appointment, please call your pharmacy.

## 2023-10-11 NOTE — Progress Notes (Addendum)
Cardiology Office Note:  .   Date:  10/11/2023  ID:  [name], DOB 1991/02/14, MRN [id number] PCP: Aviva Kluver  [facility] Providers Cardiologist:  Dina Rich, MD    History of Present Illness: .   [name] is a 32 y.o. male with a PMH of CAD, s/p NSTEMI, hypertension, GERD, hx of tobacco abuse, alcohol abuse, marijuana use, possible ? Remote mini strokes, who presents today for hospital follow-up.   Hospitalized October 2024 for chest pain.  Troponins peaked at 6,072.  Blood pressures were elevated and heart rate was reported at 30s night before presentation to hospital.  Left heart cath showed 80% stenosis in RCA that was felt to be culprit lesion, treated successfully with PCI/DES.  Also noted to have mild nonobstructive stenosis in distal RCA and distal left circumflex and was treated medically.  Was started on aspirin and Brilinta, DAPT recommended for 12 months.  ED visit on August 23, 2023 and noted bruising to his abdomen.  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  Supportive care discussed, recommended follow-up with cardiology as planned.  I last saw him for hospital follow-up on August 28, 2023. Was overall doing well.   Today he presents for follow-up. 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 Has not been monitoring his BP at home, does not own a home cuff. Says he reports drinking about 12-14 ounces of caffeine each day, also occasionally drinks sodas. Denies any shortness of breath, syncope, presyncope, dizziness, orthopnea, PND, swelling or significant weight changes, acute bleeding, or claudication. Also admits to brief palpitations at times.  ROS: Negative, See HPI.  FH: Father  had bypass before age 56. SH: Works as a Psychologist, occupational.  Denies any tobacco use.  Drinks alcohol socially, smokes marijuana occasionally.  Studies Reviewed: Marland Kitchen    Left Heart Catheterization 08/06/23: Hemodynamic data: LV: 10/24/1953/-1, EDP 10 mmHg.  Ao 143/105, mean 125 mmHg.  No pressure gradient across the aortic valve.   Angiographic data: RCA: Very large caliber vessel and dominant vessel.  There is mild ectasia noted in the proximal and mid segment.  There is a focal ulcerated 80% stenosis in the proximal segment, about a 30% mid stenosis and a 30% distal stenosis.  Slow flow is evident throughout the right coronary artery. LM: Very large-caliber vessel, smooth and normal. LAD: Large caliber vessel giving origin to large D1 and several small D2 and D3 vessels, minimal disease is evident in the midsegment. LCx: Large-caliber vessel, continues as a very large OM 3 after giving origin to very small OM1 and 2.  AV groove circumflex is small.  The distal circumflex has a 30-40 % diffuse stenosis.   Intervention data: Successful direct stenting of the proximal RCA lesion with implantation of a 5.0 x 12 mm Synergy XD DES deployed at 14 atmospheric pressure, stenosis reduced from 80% to 0% with TIMI-3 to TIMI-3 flow.  Physical Exam:   VS:  BP (!) 143/112 (BP Location: Right Arm, Patient Position: Sitting, Cuff Size: Normal)   Pulse 86   Ht 5\' 11"  (1.803 m)   Wt 230 lb (104.3 kg)   SpO2 97%   BMI 32.08 kg/m    Wt Readings from Last 3 Encounters:  10/11/23 230 lb (104.3 kg)  08/28/23 225 lb 3.2 oz (102.2 kg)  08/05/23 225 lb (102.1 kg)    GEN: Well nourished, well developed in no acute distress, pt is jittery NECK: No JVD; No carotid bruits CARDIAC: S1/S2, RRR, no murmurs, rubs, gallops, 2+ peripheral pulses RESPIRATORY:  Clear to auscultation without rales, wheezing or rhonchi  ABDOMEN: Soft, non-tender, non-distended EXTREMITIES:  No edema; No deformity   ASSESSMENT AND PLAN: .    CAD,  s/p NSTEMI, atypical chest pain Admits to atypical symptoms, does not appear cardiac related. Denies any anginal symptoms. No indication for ischemic evaluation. Discussed conservative measures with him.  Continue aspirin, atorvastatin, losartan, Brilinta, and nitroglycerin as needed.  Heart healthy diet and regular cardiovascular exercise encouraged.    2. HTN Patient has a longstanding history of high blood pressure since 32 years old.  Blood pressure elevated today, admits to caffeine intake. Just took his medications not long ago. Discussed SBP goal is less than 130.  Says he has used marijuana in the past to help with this. Discussed to monitor BP at home at least 2 hours after medications and sitting for 5-10 minutes.  Continue losartan and amlodipine. Have already provided Rx for BP cuff. Given salty six diet sheet. Heart healthy diet and regular cardiovascular exercise encouraged.  Will bring him back in 2 weeks for a BP check.  If SBP is not at goal consistently, plan to increase losartan to 75 mg daily.  3. HLD Started on atorvastatin around d/c. Tolerating well. Will obtain FLP/LFT.  Continue current medication regimen. Heart healthy diet and regular cardiovascular exercise encouraged.   4. Palpitations Patient's description sounds benign. Discussed to wean off caffeine/soda consumption. Continue current medication regimen. May need to consider low dose Coreg in future if needed and if BP is not at goal.   4. Marijuana use Marijuana use cessation encouraged and discussed. Discussed the risks of using marijuana.     Dispo: Follow-up with me/APP in 4-6 weeks or sooner if anything changes.  Signed, Sharlene Dory, NP

## 2023-10-25 ENCOUNTER — Ambulatory Visit: Payer: Managed Care, Other (non HMO)

## 2023-11-09 ENCOUNTER — Ambulatory Visit
Admission: EM | Admit: 2023-11-09 | Discharge: 2023-11-09 | Disposition: A | Discharge status: Home / Self Care | Payer: Managed Care, Other (non HMO) | Attending: Nurse Practitioner | Admitting: Nurse Practitioner

## 2023-11-09 DIAGNOSIS — L03031 Cellulitis of right toe: Secondary | ICD-10-CM | POA: Diagnosis not present

## 2023-11-09 MED ORDER — MUPIROCIN 2 % EX OINT: 1.0000 | TOPICAL_OINTMENT | Freq: Two times a day (BID) | CUTANEOUS | 0 refills | Status: AC

## 2023-11-09 MED ORDER — CHLORHEXIDINE GLUCONATE 4 % EX SOLN: Freq: Every day | CUTANEOUS | 0 refills | Status: DC | PRN

## 2023-11-09 MED ORDER — CEFTRIAXONE SODIUM 1 G IJ SOLR
1.0000 g | Freq: Once | INTRAMUSCULAR | Status: AC
  Administered 2023-11-09: 1 g via INTRAMUSCULAR

## 2023-11-09 MED ORDER — DOXYCYCLINE HYCLATE 100 MG PO TABS: 100.0000 mg | ORAL_TABLET | Freq: Two times a day (BID) | ORAL | 0 refills | Status: AC

## 2023-11-09 NOTE — ED Provider Notes (Signed)
[facility]    CSN: [id number] Arrival date & time: 11/09/23  1102      History   Chief Complaint No chief complaint on file.   HPI [name] is a 33 y.o. male.   The history is provided by the patient.   Patient presents for pain, swelling, and redness of the right great toe.  Patient states symptoms started over the past several days after he stepped his toe on a wooden object.  Patient states the toe developed a blister, which he popped over the last 2 days.  He states that the area has began to drain, and has drained since that time.  He denies fever, chills, chest pain, abdominal pain, nausea, vomiting, diarrhea, or rash.  Patient describes the pain in his great toe as "burning."  He was seen at [facility] urgent care with imaging of the right foot and ankle performed, no fractures or dislocations noted.  Patient was diagnosed with a sprain of the right ankle. Past Medical History:  Diagnosis Date   HTN (hypertension)    MI (myocardial infarction) (HCC) 08/05/2023    Patient Active Problem List   Diagnosis Date Noted   Hyperlipidemia 08/06/2023   NSTEMI (non-ST elevated myocardial infarction) (HCC) 08/05/2023   HTN (hypertension) 08/05/2023   SHOULDER PAIN, LEFT 07/16/2008   CLOSED FRACTURE OF SHAFT OF ULNA 05/13/2008    Past Surgical History:  Procedure Laterality Date   CORONARY STENT INTERVENTION N/A 08/06/2023   Procedure: CORONARY STENT INTERVENTION;  Surgeon: Yates Decamp, MD;  Location: [facility];  Service: Cardiovascular;  Laterality: N/A;   LEFT HEART CATH AND CORONARY ANGIOGRAPHY N/A 08/06/2023   Procedure: LEFT HEART CATH AND CORONARY ANGIOGRAPHY;  Surgeon: Yates Decamp, MD;  Location: [facility];  Service: Cardiovascular;  Laterality: N/A;       Home Medications    Prior to Admission medications   Medication Sig Start Date End Date Taking? Authorizing Provider  chlorhexidine (HIBICLENS) 4 % external liquid Apply  topically daily as needed. Mix solution with water and cleanse the right great toe twice daily while symptoms persist. 11/09/23  Yes Leath-Warren, Sadie Haber, NP  doxycycline (VIBRA-TABS) 100 MG tablet Take 1 tablet (100 mg total) by mouth 2 (two) times daily for 7 days. 11/09/23 11/16/23 Yes Leath-Warren, Sadie Haber, NP  mupirocin ointment (BACTROBAN) 2 % Apply 1 Application topically 2 (two) times daily for 14 days. 11/09/23 11/23/23 Yes Leath-Warren, Sadie Haber, NP  amLODipine (NORVASC) 5 MG tablet Take 1 tablet (5 mg total) by mouth daily. 08/07/23   Marcelino Duster, PA  aspirin EC 81 MG tablet Take 1 tablet (81 mg total) by mouth daily. Swallow whole. 08/07/23   Duke, Roe Rutherford, PA  atorvastatin (LIPITOR) 80 MG tablet Take 1 tablet (80 mg total) by mouth daily. 08/07/23   Marcelino Duster, PA  Blood Pressure Monitor DEVI 1 each by Does not apply route daily. Adult size large electric omron cuff 08/28/23   Sharlene Dory, NP  DULoxetine (CYMBALTA) 30 MG capsule TAKE ONE CAPSULE (30MG  TOTAL) BY MOUTH TWO TIMES DAILY 10/08/23   Yates Decamp, MD  losartan (COZAAR) 50 MG tablet Take 1 tablet (50 mg total) by mouth daily. 08/06/23 11/04/23  Duke, Roe Rutherford, PA  nitroGLYCERIN (NITROSTAT) 0.4 MG SL tablet Place 1 tablet (0.4 mg total) under the tongue every 5 (five) minutes as needed for chest pain. 08/06/23   Marcelino Duster, PA  pantoprazole (PROTONIX) 40 MG  tablet Take 1 tablet (40 mg total) by mouth daily. 08/07/23   Duke, Roe Rutherford, PA  ticagrelor (BRILINTA) 90 MG TABS tablet Take 1 tablet (90 mg total) by mouth 2 (two) times daily. 08/06/23   Duke, Roe Rutherford, PA    Family History Family History  Problem Relation Age of Onset   Heart attack Paternal Grandfather 69       CABG    Social History Social History   Tobacco Use   Smoking status: Never   Smokeless tobacco: Never  Vaping Use   Vaping status: Every Day   Substances: Nicotine  Substance Use Topics   Alcohol  use: No   Drug use: Yes    Types: Marijuana     Allergies   Patient has no known allergies.   Review of Systems Review of Systems Per HPI  Physical Exam Triage Vital Signs ED Triage Vitals  Encounter Vitals Group     BP 11/09/23 1156 (!) 158/104     Systolic BP Percentile --      Diastolic BP Percentile --      Pulse Rate 11/09/23 1156 92     Resp 11/09/23 1156 20     Temp 11/09/23 1156 98.2 F (36.8 C)     Temp Source 11/09/23 1156 Oral     SpO2 11/09/23 1156 96 %     Weight --      Height --      Head Circumference --      Peak Flow --      Pain Score 11/09/23 1155 5     Pain Loc --      Pain Education --      Exclude from Growth Chart --    No data found.  Updated Vital Signs BP (!) 158/104 (BP Location: Right Arm)   Pulse 92   Temp 98.2 F (36.8 C) (Oral)   Resp 20   SpO2 96%   Visual Acuity Right Eye Distance:   Left Eye Distance:   Bilateral Distance:    Right Eye Near:   Left Eye Near:    Bilateral Near:     Physical Exam Vitals and nursing note reviewed.  Constitutional:      General: He is not in acute distress.    Appearance: Normal appearance.  HENT:     Head: Normocephalic.  Eyes:     Extraocular Movements: Extraocular movements intact.     Pupils: Pupils are equal, round, and reactive to light.  Cardiovascular:     Rate and Rhythm: Normal rate and regular rhythm.     Pulses: Normal pulses.     Heart sounds: Normal heart sounds.  Pulmonary:     Effort: Pulmonary effort is normal.     Breath sounds: Normal breath sounds.  Abdominal:     General: Bowel sounds are normal.     Palpations: Abdomen is soft.  Musculoskeletal:     Cervical back: Normal range of motion.  Feet:     Right foot:     Skin integrity: Erythema and warmth present.     Comments: Erythema, warmth, and swelling of the right great toe.  Paronychia noted to the base of the right great toe, clear drainage is present.  +2 DP/PT pulses, neurovascular status is  intact.  See attached images. Skin:    General: Skin is warm and dry.  Neurological:     General: No focal deficit present.     Mental Status: He is alert and  oriented to person, place, and time.  Psychiatric:        Mood and Affect: Mood normal.        Behavior: Behavior normal.           UC Treatments / Results  Labs (all labs ordered are listed, but only abnormal results are displayed) Labs Reviewed - No data to display  EKG   Radiology No results found.  Procedures Procedures (including critical care time)  Medications Ordered in UC Medications  cefTRIAXone (ROCEPHIN) injection 1 g (1 g Intramuscular Given 11/09/23 1229)    Initial Impression / Assessment and Plan / UC Course  I have reviewed the triage vital signs and the nursing notes.  Pertinent labs & imaging results that were available during my care of the patient were reviewed by me and considered in my medical decision making (see chart for details).  Symptoms consistent with a paronychia.  Patient drained the toe over the past 2 days, which may have triggered possible cellulitis.  Ceftriaxone 1 g administered, will start patient on doxycycline 100 mg twice daily for the next 7 days, Hibiclens 4% solution to cleanse the affected toe, and mupirocin 2% ointment to apply daily.  Postop shoe was provided.  Supportive care recommendations were provided and discussed with the patient to include over-the-counter analgesics, elevating the right lower extremity is much as possible while symptoms persist, ice to help with pain and swelling, and to monitor for worsening symptoms.  Discussed indications with the patient regarding ER follow-up.  Recommend follow-up within the next 48 hours if symptoms fail to improve.  Patient was advised if symptoms fail to improve, recommend that he follow-up with his primary care physician for further evaluation.  Patient was in agreement with this plan of care and verbalized understanding.   All questions were answered.  Patient stable for discharge.  Work note was provided (patient states that he works Monday through Friday).  Final Clinical Impressions(s) / UC Diagnoses   Final diagnoses:  Paronychia of great toe of right foot  Cellulitis of great toe of right foot     Discharge Instructions      You were given an injection of Rocephin 1 g today.  Start the antibiotic on 11/10/2023. May take over-the-counter Tylenol or ibuprofen as needed for pain, fever, or general discomfort. Elevate the right lower extremity above the level of the heart is much as possible while symptoms persist. Wear the postop shoe provided today until symptoms improve. May apply ice to the affected area at least 3-4 times daily to help with pain and swelling. Monitor to the area for worsening.  If you experience redness that goes into the right foot or up the right leg, or if you experience new symptoms of fever, chills, please go to the emergency department immediately for further evaluation. If symptoms do not improve over the next 48 hours, you may follow-up in this clinic for reevaluation. Follow-up as needed.     ED Prescriptions     Medication Sig Dispense Auth. Provider   doxycycline (VIBRA-TABS) 100 MG tablet Take 1 tablet (100 mg total) by mouth 2 (two) times daily for 7 days. 14 tablet Leath-Warren, Sadie Haber, NP   chlorhexidine (HIBICLENS) 4 % external liquid Apply topically daily as needed. Mix solution with water and cleanse the right great toe twice daily while symptoms persist. 118 mL Leath-Warren, Sadie Haber, NP   mupirocin ointment (BACTROBAN) 2 % Apply 1 Application topically 2 (two) times daily for  14 days. 30 g Leath-Warren, Sadie Haber, NP      PDMP not reviewed this encounter.   Abran Cantor, NP 11/09/23 1240

## 2023-11-09 NOTE — ED Triage Notes (Signed)
Pt reports after already having burning and pain in his right first toe x 3 days, pt developed an abscess in which he popped so it is now leaking.

## 2023-11-09 NOTE — Discharge Instructions (Addendum)
You were given an injection of Rocephin 1 g today.  Start the antibiotic on 11/10/2023. May take over-the-counter Tylenol or ibuprofen as needed for pain, fever, or general discomfort. Elevate the right lower extremity above the level of the heart is much as possible while symptoms persist. Wear the postop shoe provided today until symptoms improve. May apply ice to the affected area at least 3-4 times daily to help with pain and swelling. Monitor to the area for worsening.  If you experience redness that goes into the right foot or up the right leg, or if you experience new symptoms of fever, chills, please go to the emergency department immediately for further evaluation. If symptoms do not improve over the next 48 hours, you may follow-up in this clinic for reevaluation. Follow-up as needed.

## 2023-11-22 ENCOUNTER — Encounter: Payer: Self-pay | Admitting: Nurse Practitioner

## 2023-11-22 ENCOUNTER — Ambulatory Visit: Payer: Managed Care, Other (non HMO) | Attending: Nurse Practitioner | Admitting: Nurse Practitioner

## 2023-11-22 ENCOUNTER — Telehealth: Payer: Self-pay | Admitting: Nurse Practitioner

## 2023-11-22 VITALS — BP 130/92 | HR 75 | Ht 71.0 in | Wt 221.0 lb

## 2023-11-22 DIAGNOSIS — I2510 Atherosclerotic heart disease of native coronary artery without angina pectoris: Secondary | ICD-10-CM

## 2023-11-22 DIAGNOSIS — E785 Hyperlipidemia, unspecified: Secondary | ICD-10-CM

## 2023-11-22 DIAGNOSIS — Z758 Other problems related to medical facilities and other health care: Secondary | ICD-10-CM

## 2023-11-22 DIAGNOSIS — I10 Essential (primary) hypertension: Secondary | ICD-10-CM | POA: Diagnosis not present

## 2023-11-22 DIAGNOSIS — Z7289 Other problems related to lifestyle: Secondary | ICD-10-CM

## 2023-11-22 DIAGNOSIS — Z720 Tobacco use: Secondary | ICD-10-CM | POA: Diagnosis not present

## 2023-11-22 NOTE — Telephone Encounter (Signed)
Patient can come in on 12/11/23 at 8:30AM

## 2023-11-22 NOTE — Progress Notes (Signed)
Cardiology Office Note:  .   Date:  11/22/2023  ID:  VEDANSH Rogers, DOB 1991-05-30, MRN [id number] PCP: Patient, No Pcp Per   [facility] Providers Cardiologist:  Dina Rich, MD    History of Present Illness: .   [name] is a 33 y.o. male with a PMH of CAD, s/p NSTEMI, hypertension, GERD, hx of tobacco abuse, alcohol abuse, marijuana use, possible ? Remote mini strokes, tobacco use, vaping use, who presents today for follow-up.   Hospitalized October 2024 for chest pain.  Troponins peaked at 6,072.  Blood pressures were elevated and heart rate was reported at 30s night before presentation to hospital.  Left heart cath showed 80% stenosis in RCA that was felt to be culprit lesion, treated successfully with PCI/DES.  Also noted to have mild nonobstructive stenosis in distal RCA and distal left circumflex and was treated medically.  Was started on aspirin and Brilinta, DAPT recommended for 12 months.  ED visit on August 23, 2023 and noted bruising to his abdomen.  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  Supportive care discussed, recommended follow-up with cardiology as planned.  Seen for follow-up on August 28, 2023.  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 Has not been monitoring his BP at home, does not own a home cuff. Says he reports drinking about 12-14 ounces of caffeine each day, also occasionally drinks sodas. Denies any shortness of breath, syncope, presyncope, dizziness, orthopnea, PND, swelling or significant weight changes, acute bleeding, or claudication. Also admits to brief palpitations at times.  ED visit on 11/06/2023 at [facility] for right foot and ankle pain. XR imaging of foot and ankle WNL.   It was suspected that he sprained his ankle and repeated injury prevented it from healing appropriately.  Was placed in splint, conservative measures performed and discussed with patient.   [place] urgent care visit on November 09, 2023 for pain, swelling, and redness of right great toe after stepping on a wooden object.  Patient stated toe developed a blister that he popped over the last 2 days, area began to drain, described a burning sensation.  Was diagnosed with paronychia, possible cellulitis.  He was given ceftriaxone 1 g in office, started on doxycycline 100 mg twice daily for 7 days, mupirocin ointment and Hibiclens solution, supportive care recommendations provided and discussed.  11/22/2023 - Since I last saw patient, he tells me he was "left for dead" on 2023-11-09 and was in jail for 2 weeks.  Was told he was detoxing for 3 days but was unsure regarding his alcohol use.  Says his jail time was due to relationship issues.  While in jail, he had an episode of chest pain that did not seem to go away, says his memory was fuzzy at that time, but says the pain eventually put him back to sleep, denies any recurrent chest pain since off alcohol and marijuana.  Since he was put in jail, he says he has not been drinking or smoking pot and is currently in a program.  He says now he feels "10 times better and healthwise has been good" since he is no longer drinking or smoking marijuana.  Does admit to current stress, reports getting 6 hours  of sleep when normally he gets 7 to 8 hours of sleep, currently admits to twitching under his left eye.  Denies any red flag signs or symptoms.  Denies any recurrent chest pain, shortness of breath, palpitations, syncope, presyncope, dizziness, orthopnea, PND, swelling or significant weight changes, acute bleeding, or claudication.  He is requesting a refill of duloxetine.  Says he has been all out of this medicine, and has been taking his wife's Prozac in the  meantime, says he has been on Prozac in the past. Does not have a PCP.  He says his right great toe is getting better since he has been receiving treatment for this.  ROS: Negative, See HPI.  FH: Father had bypass before age 82. SH: Works as a Psychologist, occupational.  Denies any tobacco use.  Drinks alcohol socially, smokes marijuana occasionally.  Studies Reviewed: Marland Kitchen    EKG: EKG is not ordered today.  Left Heart Catheterization 08/06/23: Hemodynamic data: LV: 10/24/1953/-1, EDP 10 mmHg.  Ao 143/105, mean 125 mmHg.  No pressure gradient across the aortic valve.   Angiographic data: RCA: Very large caliber vessel and dominant vessel.  There is mild ectasia noted in the proximal and mid segment.  There is a focal ulcerated 80% stenosis in the proximal segment, about a 30% mid stenosis and a 30% distal stenosis.  Slow flow is evident throughout the right coronary artery. LM: Very large-caliber vessel, smooth and normal. LAD: Large caliber vessel giving origin to large D1 and several small D2 and D3 vessels, minimal disease is evident in the midsegment. LCx: Large-caliber vessel, continues as a very large OM 3 after giving origin to very small OM1 and 2.  AV groove circumflex is small.  The distal circumflex has a 30-40 % diffuse stenosis.   Intervention data: Successful direct stenting of the proximal RCA lesion with implantation of a 5.0 x 12 mm Synergy XD DES deployed at 14 atmospheric pressure, stenosis reduced from 80% to 0% with TIMI-3 to TIMI-3 flow.  Physical Exam:   VS:  BP (!) 130/92   Pulse 75   Ht 5\' 11"  (1.803 m)   Wt 221 lb (100.2 kg)   SpO2 97%   BMI 30.82 kg/m    Wt Readings from Last 3 Encounters:  11/22/23 221 lb (100.2 kg)  10/11/23 230 lb (104.3 kg)  08/28/23 225 lb 3.2 oz (102.2 kg)    GEN: Well nourished, well developed in no acute distress, pt is jittery NECK: No JVD; No carotid bruits CARDIAC: S1/S2, RRR, no murmurs, rubs, gallops, 2+ peripheral pulses RESPIRATORY:  Clear to  auscultation without rales, wheezing or rhonchi  ABDOMEN: Soft, non-tender, non-distended EXTREMITIES:  No edema; No deformity   ASSESSMENT AND PLAN: .    CAD, s/p NSTEMI Denies any anginal symptoms since stopping alcohol abuse and marijuana use. No indication for ischemic evaluation. Continue aspirin, atorvastatin, losartan, Brilinta, and nitroglycerin as needed.  Heart healthy diet and regular cardiovascular exercise encouraged.  Care and ED precautions discussed.   2. HTN Patient has a longstanding history of high blood pressure since 33 years old.  Blood pressure mildly today, admits to caffeine intake. Discussed SBP goal is less than 130.  Discussed to monitor BP at home at least 2 hours after medications and sitting for 5-10 minutes.  Continue losartan and amlodipine. Given salty six diet sheet. Heart healthy diet and regular cardiovascular exercise encouraged.  Will bring him back in 2 weeks for a BP check.  If SBP is not at  goal consistently, plan to increase losartan to 75 mg daily and/or start low dose Coreg in future.  3. HLD Started on atorvastatin around d/c. Tolerating well.  Has not had lab work that was previously arranged done, pt defers this to his PCP - will refer him as noted below, encouraged him to get this lab work completed.  He verbalized understanding. Continue current medication regimen. Heart healthy diet and regular cardiovascular exercise encouraged.   4.  Tobacco use, engages in vaping Says he smokes 1 pack of cigarettes every 3 to 4 days.  He does admit to vaping.  No longer using marijuana, says he has stopped alcohol abuse. No longer drinking alcohol.  He is currently in a program. Smoking cessation encouraged and discussed. Discussed risks of vaping.   5.  Does not have PCP Will provide referral per his request.   Dispo: Follow-up with me/APP in 3-4 months or sooner if anything changes.  Signed, Sharlene Dory, NP

## 2023-11-22 NOTE — Telephone Encounter (Signed)
-----   Message from Sharlene Dory sent at 11/22/2023 10:48 AM EST ----- Can we please bring him back in 2-3 weeks for a BP check? He needs to bring his device. Told him about this during visit.   Thanks!   Best,  Sharlene Dory, NP

## 2023-11-22 NOTE — Telephone Encounter (Signed)
Left patient voicemail explaining this and to call us back or either send a mychart message

## 2023-11-22 NOTE — Patient Instructions (Addendum)
Medication Instructions:  Your physician recommends that you continue on your current medications as directed. Please refer to the Current Medication list given to you today.  Labwork: Please have previous labs done   Testing/Procedures: None   Follow-Up: Your physician recommends that you schedule a follow-up appointment in: 3-4 months   Any Other Special Instructions Will Be Listed Below (If Applicable).  If you need a refill on your cardiac medications before your next appointment, please call your pharmacy.

## 2023-12-11 ENCOUNTER — Ambulatory Visit: Payer: Managed Care, Other (non HMO) | Attending: Internal Medicine

## 2023-12-11 ENCOUNTER — Telehealth: Payer: Self-pay

## 2023-12-11 VITALS — BP 152/107 | HR 84 | Ht 71.0 in

## 2023-12-11 DIAGNOSIS — I10 Essential (primary) hypertension: Secondary | ICD-10-CM

## 2023-12-11 MED ORDER — LOSARTAN POTASSIUM 100 MG PO TABS: 100.0000 mg | ORAL_TABLET | Freq: Every day | ORAL | 5 refills | Status: DC

## 2023-12-11 NOTE — Progress Notes (Signed)
Patient on for BP check and to check BP home monitor. Patient verified medications and reported that he took his BP medication about 7:30 this morning. I took his BP after having him sit about 5 minutes 150/104 left side. Took with his manual monitor 152/107. Waited about another 5 minutes to see if it went down. BP 152/110 then checked on his machine 153/120. Patient reported no symptoms of SOB, Chest pains or dizziness while he was here. Is aware of ER precautions and chest pain. Advised him will send to provider.

## 2023-12-11 NOTE — Telephone Encounter (Signed)
The patient has been notified of the result and verbalized understanding.  All questions (if any) were answered. [name], [place] 12/11/2023 10:10 AM

## 2023-12-11 NOTE — Telephone Encounter (Signed)
-----   Message from Sharlene Dory sent at 12/11/2023  9:33 AM EST ----- Chart reviewed. BP not at goal. Please increase Losartan to 100 mg daily and repeat BMET in 2 weeks. Diagnosis for lab work is: hypertension. Please bring him back in 6-8 weeks for nurse visit for BP check.   Thanks!   Best,  Sharlene Dory, NP ----- Message ----- From: Carmelina Paddock, CMA Sent: 12/11/2023   9:31 AM EST To: Sharlene Dory, NP

## 2024-01-15 ENCOUNTER — Telehealth: Payer: Self-pay | Admitting: Cardiology

## 2024-01-15 NOTE — Telephone Encounter (Signed)
 Pt c/o swelling: STAT is pt has developed SOB within 24 hours  How much weight have you gained and in what time span? 43lbs since Jan 7th  If swelling, where is the swelling located? Feet to knee and hands (tingling in hands and feet)  Are you currently taking a fluid pill? unsure  Are you currently SOB? Starting to notice it a little bit   Do you have a log of your daily weights (if so, list)?    Have you gained 3 pounds in a day or 5 pounds in a week? unsure  Have you traveled recently? No

## 2024-01-16 NOTE — Telephone Encounter (Signed)
 Left message to return call.  Recent increase on his Losartan to 100mg  on 12/11/2023.  Did not get BMET 2 wks after as requested.  Has BP already scheduled for 01/22/24 & next OV is scheduled for 02/28/24 with Sharlene Dory, NP.  Weight on 11/22/23 was 221lb & BP 130/92.

## 2024-01-16 NOTE — Telephone Encounter (Signed)
 Patient notified and verbalized understanding.  He will go to [employer].

## 2024-01-16 NOTE — Telephone Encounter (Signed)
 Weights up 30 lbs if reported numbers are accurate compared with his Jan clinic visit, along with increased leg swelling would be concerned about significant fluid retention. Needs ER evaluation  Dominga Ferry MD

## 2024-01-16 NOTE — Telephone Encounter (Signed)
 Spoke with patient - states feet swelling up to calves x 3-4 days.  Had this at last OV on 1/30 but is getting worse.  States he feels like his hands are tingly and has increased SOB, mostly with activity.  BP elevated and running 162/133, 143/116, 156/117, 175/122 & HR running 86-96.  States his weight yesterday at home was 249 lb & 249 lb now - states his weight on 10/29/23 on same scales was 204lb.  BP recheck on phone after sitting a few minutes was 157/122  HR 106.  Explained to patient that provider may suggest going to the ED for evaluation, but will send message to him.

## 2024-01-17 ENCOUNTER — Other Ambulatory Visit: Payer: Self-pay

## 2024-01-17 ENCOUNTER — Emergency Department (HOSPITAL_COMMUNITY): Admission: EM | Admit: 2024-01-17 | Discharge: 2024-01-17 | Disposition: A | Discharge status: Home / Self Care

## 2024-01-17 ENCOUNTER — Encounter (HOSPITAL_COMMUNITY): Payer: Self-pay

## 2024-01-17 ENCOUNTER — Emergency Department (HOSPITAL_COMMUNITY)

## 2024-01-17 DIAGNOSIS — R079 Chest pain, unspecified: Secondary | ICD-10-CM | POA: Diagnosis not present

## 2024-01-17 DIAGNOSIS — R600 Localized edema: Secondary | ICD-10-CM | POA: Diagnosis not present

## 2024-01-17 DIAGNOSIS — M7989 Other specified soft tissue disorders: Secondary | ICD-10-CM | POA: Diagnosis present

## 2024-01-17 DIAGNOSIS — Z7982 Long term (current) use of aspirin: Secondary | ICD-10-CM | POA: Diagnosis not present

## 2024-01-17 DIAGNOSIS — R609 Edema, unspecified: Secondary | ICD-10-CM

## 2024-01-17 DIAGNOSIS — R0602 Shortness of breath: Secondary | ICD-10-CM | POA: Insufficient documentation

## 2024-01-17 LAB — BASIC METABOLIC PANEL WITH GFR
Anion gap: 12 (ref 5–15)
BUN: 17 mg/dL (ref 6–20)
CO2: 25 mmol/L (ref 22–32)
Calcium: 9.6 mg/dL (ref 8.9–10.3)
Chloride: 103 mmol/L (ref 98–111)
Creatinine, Ser: 0.87 mg/dL (ref 0.61–1.24)
GFR, Estimated: >60 mL/min (ref >60)
Glucose, Bld: 95 mg/dL (ref 70–99)
Potassium: 3.9 mmol/L (ref 3.5–5.1)
Sodium: 140 mmol/L (ref 135–145)

## 2024-01-17 LAB — CBC
HCT: 43.1 % (ref 39.0–52.0)
Hemoglobin: 14.4 g/dL (ref 13.0–17.0)
MCH: 31.2 pg (ref 26.0–34.0)
MCHC: 33.4 g/dL (ref 30.0–36.0)
MCV: 93.3 fL (ref 80.0–100.0)
Platelets: 281 10*3/uL (ref 150–400)
RBC: 4.62 MIL/uL (ref 4.22–5.81)
RDW: 12.6 % (ref 11.5–15.5)
WBC: 6.9 10*3/uL (ref 4.0–10.5)
nRBC: 0 % (ref 0.0–0.2)

## 2024-01-17 LAB — URINALYSIS, ROUTINE W REFLEX MICROSCOPIC
Bilirubin Urine: NEGATIVE
Glucose, UA: NEGATIVE mg/dL
Hgb urine dipstick: NEGATIVE
Ketones, ur: NEGATIVE mg/dL
Leukocytes,Ua: NEGATIVE
Nitrite: NEGATIVE
Protein, ur: NEGATIVE mg/dL
Specific Gravity, Urine: 1.012 (ref 1.005–1.030)
pH: 7 (ref 5.0–8.0)

## 2024-01-17 LAB — BRAIN NATRIURETIC PEPTIDE: B Natriuretic Peptide: 12 pg/mL (ref 0.0–100.0)

## 2024-01-17 LAB — D-DIMER, QUANTITATIVE: D-Dimer, Quant: <0.27 ug{FEU}/mL (ref 0.00–0.50)

## 2024-01-17 LAB — TROPONIN I (HIGH SENSITIVITY)
Troponin I (High Sensitivity): 4 ng/L (ref <18)
Troponin I (High Sensitivity): 4 ng/L (ref <18)

## 2024-01-17 NOTE — ED Notes (Signed)
 Patient is aware that we need a urine sample, urinal at bedside.

## 2024-01-17 NOTE — Discharge Instructions (Signed)
 Please follow-up closely with your cardiologist as scheduled.  Please start wearing compression stockings to help with the edema.  Return to emergency department immediately for any new or worsening symptoms.

## 2024-01-17 NOTE — ED Provider Notes (Signed)
 [place] EMERGENCY DEPARTMENT AT [facility] Provider Note   CSN: [id number] Arrival date & time: 01/17/24  0865     History  Chief Complaint  Patient presents with   Chest Pain    [name] is a 33 y.o. male.  Patient is a 33 year old male who presents to the emergency department the chief complaint of increased swelling to bilateral lower extremities.  Patient also notes that he has had an approximately 40 pound weight gain over the past 2 months.  He notes that he last saw his cardiologist approximately 1 month ago.  He notes he did reach out to his cardiologist today who recommended he come to emergency department for further evaluation.  He notes that the edema is worse after standing for periods of time and improves with rest.  He admits to some intermittent shortness of breath and intermittent chest pain but denies any active chest pain or shortness of breath at this time.  He denies any dizziness, lightheadedness or syncope.   Chest Pain      Home Medications Prior to Admission medications   Medication Sig Start Date End Date Taking? Authorizing Provider  amLODipine (NORVASC) 5 MG tablet Take 1 tablet (5 mg total) by mouth daily. 08/07/23   Marcelino Duster, PA  aspirin EC 81 MG tablet Take 1 tablet (81 mg total) by mouth daily. Swallow whole. 08/07/23   Duke, Roe Rutherford, PA  atorvastatin (LIPITOR) 80 MG tablet Take 1 tablet (80 mg total) by mouth daily. 08/07/23   Marcelino Duster, PA  Blood Pressure Monitor DEVI 1 each by Does not apply route daily. Adult size large electric omron cuff 08/28/23   Sharlene Dory, NP  chlorhexidine (HIBICLENS) 4 % external liquid Apply topically daily as needed. Mix solution with water and cleanse the right great toe twice daily while symptoms persist. 11/09/23   Leath-Warren, Sadie Haber, NP  DULoxetine (CYMBALTA) 30 MG capsule TAKE ONE CAPSULE (30MG  TOTAL) BY MOUTH TWO TIMES DAILY Patient not taking: Reported on  12/11/2023 10/08/23   Yates Decamp, MD  losartan (COZAAR) 100 MG tablet Take 1 tablet (100 mg total) by mouth daily. 12/11/23   Sharlene Dory, NP  nitroGLYCERIN (NITROSTAT) 0.4 MG SL tablet Place 1 tablet (0.4 mg total) under the tongue every 5 (five) minutes as needed for chest pain. 08/06/23   Duke, Roe Rutherford, PA  pantoprazole (PROTONIX) 40 MG tablet Take 1 tablet (40 mg total) by mouth daily. 08/07/23   Duke, Roe Rutherford, PA  ticagrelor (BRILINTA) 90 MG TABS tablet Take 1 tablet (90 mg total) by mouth 2 (two) times daily. Patient not taking: Reported on 12/11/2023 08/06/23   Marcelino Duster, PA      Allergies    Patient has no known allergies.    Review of Systems   Review of Systems  Cardiovascular:  Positive for chest pain and leg swelling.  All other systems reviewed and are negative.   Physical Exam Updated Vital Signs BP (!) 145/111 (BP Location: Right Arm)   Pulse (!) 101   Temp 98.2 F (36.8 C) (Oral)   Resp 18   Ht 5\' 11"  (1.803 m)   Wt 113.4 kg   SpO2 99%   BMI 34.87 kg/m  Physical Exam Vitals and nursing note reviewed.  Constitutional:      Appearance: Normal appearance.  HENT:     Head: Normocephalic and atraumatic.     Nose: Nose normal.     Mouth/Throat:  Mouth: Mucous membranes are moist.  Eyes:     Extraocular Movements: Extraocular movements intact.     Conjunctiva/sclera: Conjunctivae normal.     Pupils: Pupils are equal, round, and reactive to light.  Cardiovascular:     Rate and Rhythm: Normal rate and regular rhythm.     Pulses: Normal pulses.     Heart sounds: Normal heart sounds. Heart sounds not distant. No murmur heard. Pulmonary:     Effort: Pulmonary effort is normal. No tachypnea or respiratory distress.     Breath sounds: Normal breath sounds. No stridor. No decreased breath sounds, wheezing, rhonchi or rales.  Chest:     Chest wall: No tenderness.  Abdominal:     General: Abdomen is flat. Bowel sounds are normal. There is  no abdominal bruit.     Palpations: Abdomen is soft.     Tenderness: There is no abdominal tenderness. There is no guarding.  Musculoskeletal:        General: Normal range of motion.     Cervical back: Normal range of motion and neck supple.     Right lower leg: Edema present.     Left lower leg: Edema present.  Skin:    General: Skin is warm and dry.     Findings: No rash.  Neurological:     General: No focal deficit present.     Mental Status: He is alert and oriented to person, place, and time. Mental status is at baseline.     Cranial Nerves: No cranial nerve deficit.     Motor: No weakness.  Psychiatric:        Mood and Affect: Mood normal.        Behavior: Behavior normal.        Thought Content: Thought content normal.        Judgment: Judgment normal.     ED Results / Procedures / Treatments   Labs (all labs ordered are listed, but only abnormal results are displayed) Labs Reviewed  BASIC METABOLIC PANEL WITH GFR  CBC  BRAIN NATRIURETIC PEPTIDE  D-DIMER, QUANTITATIVE  URINALYSIS, ROUTINE W REFLEX MICROSCOPIC  TROPONIN I (HIGH SENSITIVITY)    EKG EKG Interpretation Date/Time:  Thursday January 17 2024 09:46:02 EDT Ventricular Rate:  95 PR Interval:  142 QRS Duration:  94 QT Interval:  350 QTC Calculation: 439 R Axis:   -28  Text Interpretation: Normal sinus rhythm Minimal voltage criteria for LVH, may be normal variant ( R in aVL ) Borderline ECG When compared with ECG of 06-Aug-2023 12:57, Nonspecific T wave abnormality has replaced inverted T waves in Inferior leads Confirmed by Beckey Downing [phone]) on 01/17/2024 9:54:44 AM  Radiology No results found.  Procedures Procedures    Medications Ordered in ED Medications - No data to display  ED Course/ Medical Decision Making/ A&P                                 Medical Decision Making Amount and/or Complexity of Data Reviewed Labs: ordered.   This patient presents to the ED for concern of edema  differential diagnosis includes CHF, ACS, pulmonary embolus, dependent edema, nephrotic syndrome, nephrotic syndrome    Additional history obtained:  Additional history obtained from medical records External records from outside source obtained and reviewed including records   Lab Tests:  I Ordered, and personally interpreted labs.  The pertinent results include: Normal serial troponins, no anemia, no leukocytosis,  normal kidney function liver function, negative BNP, negative D-dimer   Imaging Studies ordered:  I ordered imaging studies including chest x-ray I independently visualized and interpreted imaging which showed no acute cardiopulmonary process I agree with the radiologist interpretation   Problem List / ED Course:  Patient is doing well at this time and is stable for discharge home.  Discussed with patient that all workup in the emergency department has been unremarkable.  Do not suspect ACS at this time as patient has no acute ischemic changes on EKG and negative serial troponins.  BNP is negative as well and do not suspect acute CHF.  Chest x-ray demonstrates no indication for fluid overload.  Patient only has mild edema noted on exam.  He has no indication for nephrotic syndrome.  D-dimer was negative and do not suspect pulmonary embolus or DVT.  Patient already has close follow-up with his cardiologist in 4 days and he was directed to keep this appointment.  Do not suspect that admission is warranted at this time.  Strict return precautions were discussed for any new or worsening symptoms.  Patient voiced understanding and had no additional questions.  Did discuss compression stockings with the patient to help with the edema.   Social Determinants of Health:  None           Final Clinical Impression(s) / ED Diagnoses Final diagnoses:  None    Rx / DC Orders ED Discharge Orders     None         Lelon Perla, PA-C 01/17/24 1254    Durwin Glaze, MD 01/18/24 [phone]

## 2024-01-17 NOTE — ED Triage Notes (Signed)
 Pt presents to ED with complaints chest pressure, bilateral leg swelling, tingling in both hands, started 1 week ago.  Chest pressure comes and goes. Sent by Cardiologist Hx MI 5 months ago

## 2024-01-22 ENCOUNTER — Encounter: Payer: Self-pay | Admitting: *Deleted

## 2024-01-22 ENCOUNTER — Ambulatory Visit: Payer: Managed Care, Other (non HMO) | Attending: Cardiology | Admitting: *Deleted

## 2024-01-22 ENCOUNTER — Other Ambulatory Visit (HOSPITAL_COMMUNITY): Payer: Self-pay

## 2024-01-22 ENCOUNTER — Telehealth: Payer: Self-pay | Admitting: Pharmacy Technician

## 2024-01-22 ENCOUNTER — Telehealth: Payer: Self-pay | Admitting: *Deleted

## 2024-01-22 VITALS — BP 138/110 | HR 92 | Ht 71.0 in | Wt 254.6 lb

## 2024-01-22 DIAGNOSIS — I10 Essential (primary) hypertension: Secondary | ICD-10-CM | POA: Diagnosis not present

## 2024-01-22 DIAGNOSIS — Z79899 Other long term (current) drug therapy: Secondary | ICD-10-CM

## 2024-01-22 DIAGNOSIS — I2510 Atherosclerotic heart disease of native coronary artery without angina pectoris: Secondary | ICD-10-CM

## 2024-01-22 DIAGNOSIS — M7989 Other specified soft tissue disorders: Secondary | ICD-10-CM

## 2024-01-22 MED ORDER — AMLODIPINE BESYLATE 10 MG PO TABS: 10.0000 mg | ORAL_TABLET | Freq: Every day | ORAL | 3 refills | Status: DC

## 2024-01-22 MED ORDER — ASPIRIN 81 MG PO TBEC: 81.0000 mg | DELAYED_RELEASE_TABLET | Freq: Every day | ORAL | 12 refills | Status: DC

## 2024-01-22 MED ORDER — CLOPIDOGREL BISULFATE 75 MG PO TABS: 300.0000 mg | ORAL_TABLET | ORAL | 3 refills | Status: DC

## 2024-01-22 MED ORDER — FUROSEMIDE 20 MG PO TABS: 20.0000 mg | ORAL_TABLET | Freq: Every day | ORAL | 3 refills | Status: DC | PRN

## 2024-01-22 NOTE — Telephone Encounter (Signed)
-----   Message from Dina Rich sent at 01/22/2024  1:46 PM EDT ----- Regarding: RE: need help with Brilinta Can stop brillinta, start plavix. Needs 300mg  x 1 day, then after that just 75mg  daily  Dominga Ferry MD ----- Message ----- From: Eustace Moore, RN Sent: 01/22/2024  12:05 PM EDT To: Antoine Poche, MD Subject: FW: need help with Brilinta                    Can you switch him to something else other than brilinta? ----- Message ----- From: Willette Cluster, CPhT Sent: 01/22/2024  10:59 AM EDT To: Eustace Moore, RN Subject: RE: need help with Brilinta                    Hi, I got the patient a coupon and ran it along with his insurance and the cost is still 464.98 for 30 days of brilinta. He has Nurse, learning disability so he would not be approved for patient assistance other than with a coupon. It says he has a high deductible and even after billing this claim his remaining deductible would be [phone]. can this be changed to something else? I can run a test claim for whatever this could be changed to to see what the copay would be if so. Thank you! ----- Message ----- From: Eustace Moore, RN Sent: 01/22/2024   8:29 AM EDT To: Rx Med Assistance Team Subject: need help with Brilinta                        Please assist in getting Brilinta. He can't afford it and said it cost >$500 per month. thanks

## 2024-01-22 NOTE — Patient Instructions (Signed)
 Your physician recommends that you continue on your current medications as directed. Please refer to the Current Medication list given to you today. We will contact you with any medication adjustments after your provider reviews your visit.

## 2024-01-22 NOTE — Progress Notes (Signed)
 Presents for blood pressure check per phone call on 12/11/2023. Medications reviewed. Reports taking all doses of medications as prescribed except Brilinta. Reports being out of Brilinta for several months due to the cost. Reports Brilinta cost over $500 per month. Request sent to [facility] team for assistance. Also gave $5 copay coupon for brilinta. Reports edema in legs and feet that started 2 weeks ago. Reports being seen in the ED last week and was advised to see cardiology and wear compression stockings. No edema noted to LE at this visit. Reports swelling occurs later in the day. Reports weight has been gradually increasing since January 2025. Reports weighing 204 lbs in January 2025 and now 254 lbs. Denies headache, blurred vision, dizziness, chest pain or SOB. Vitals done and routed to provider for review.

## 2024-01-22 NOTE — Telephone Encounter (Signed)
 Patient informed and verbalized understanding of plan. Also advised to change aspirin to 81 mg as prescribed originally Salty Six and weight log made available in mychart.

## 2024-01-22 NOTE — Telephone Encounter (Signed)
-----   Message from Sharlene Dory sent at 01/22/2024  1:03 PM EDT ----- Thank you for the update. Please increase Amlodipine to 10 mg daily.   I appreciate your help with getting him assistance regarding Brilinta.  If he has any other issues in the future,we may need to consider switching from Brilinta to Plavix due to cost issue.    Also has had a significant weight gain over the last few months.  Please arrange echocardiogram for diagnosis of leg edema.  Agree to wear compression stockings for now as needed.  Please obtain the following labs in 1 week for further evaluation of weight gain and leg edema: proBNP, BMET, and Magnesium.   Please give him salty six and Weight log sheet. Low sodium diet, fluid restriction <2L, and daily weights encouraged. He needs to contact our office for weight gain of 2 lbs overnight or 5 lbs in one week.   Follow-up with me as scheduled. If any severe or worsening symptoms: please proceed to ED for evaluation.   Thanks!   Best, Sharlene Dory, NP ----- Message ----- From: Eustace Moore, RN Sent: 01/22/2024   8:59 AM EDT To: Antoine Poche, MD; Sharlene Dory, NP

## 2024-01-22 NOTE — Telephone Encounter (Signed)
 Hi, I got the patient a coupon and ran it along with his insurance and the cost is still 464.98 for 30 days of brilinta. He has Nurse, learning disability so he would not be approved for patient assistance other than with a coupon. It says he has a high deductible and even after billing this claim his remaining deductible would be [phone]. can this be changed to something else? I can run a test claim for whatever this could be changed to to see what the copay would be if so. Thank you!  ===View-only below this line=== ----- Message ----- From: Eustace Moore, RN Sent: 01/22/2024   8:29 AM EDT To: Rx Med Assistance Team Subject: need help with Brilinta                        Please assist in getting Brilinta. He can't afford it and said it cost >$500 per month. Thanks

## 2024-01-22 NOTE — Telephone Encounter (Signed)
-----   Message from Dina Rich sent at 01/22/2024 10:32 AM EDT ----- Can add lasix 20mg  just prn swelling   Dominga Ferry MD ----- Message ----- From: Eustace Moore, RN Sent: 01/22/2024   8:59 AM EDT To: Antoine Poche, MD; Sharlene Dory, NP

## 2024-02-05 ENCOUNTER — Ambulatory Visit

## 2024-02-07 ENCOUNTER — Ambulatory Visit: Attending: Nurse Practitioner

## 2024-02-07 DIAGNOSIS — I2510 Atherosclerotic heart disease of native coronary artery without angina pectoris: Secondary | ICD-10-CM | POA: Diagnosis not present

## 2024-02-07 DIAGNOSIS — M7989 Other specified soft tissue disorders: Secondary | ICD-10-CM

## 2024-02-07 DIAGNOSIS — I361 Nonrheumatic tricuspid (valve) insufficiency: Secondary | ICD-10-CM | POA: Diagnosis not present

## 2024-02-07 DIAGNOSIS — I10 Essential (primary) hypertension: Secondary | ICD-10-CM

## 2024-02-08 LAB — ECHOCARDIOGRAM COMPLETE
AR max vel: 3.15 cm2
AV Area VTI: 3.49 cm2
AV Area mean vel: 3 cm2
AV Mean grad: 5 mmHg
AV Peak grad: 7.4 mmHg
Ao pk vel: 1.36 m/s
Area-P 1/2: 3.77 cm2
Calc EF: 55 %
MV VTI: 3.56 cm2
S' Lateral: 3.4 cm
Single Plane A2C EF: 56.6 %
Single Plane A4C EF: 56.4 %

## 2024-02-28 ENCOUNTER — Encounter: Payer: Self-pay | Admitting: Nurse Practitioner

## 2024-02-28 ENCOUNTER — Ambulatory Visit: Payer: Managed Care, Other (non HMO) | Attending: Nurse Practitioner | Admitting: Nurse Practitioner

## 2024-02-28 ENCOUNTER — Encounter: Payer: Self-pay | Admitting: Cardiology

## 2024-02-28 VITALS — BP 132/80 | HR 82 | Ht 71.0 in | Wt 265.6 lb

## 2024-02-28 DIAGNOSIS — E785 Hyperlipidemia, unspecified: Secondary | ICD-10-CM

## 2024-02-28 DIAGNOSIS — I2510 Atherosclerotic heart disease of native coronary artery without angina pectoris: Secondary | ICD-10-CM

## 2024-02-28 DIAGNOSIS — Z7289 Other problems related to lifestyle: Secondary | ICD-10-CM

## 2024-02-28 DIAGNOSIS — G629 Polyneuropathy, unspecified: Secondary | ICD-10-CM

## 2024-02-28 DIAGNOSIS — Z720 Tobacco use: Secondary | ICD-10-CM

## 2024-02-28 DIAGNOSIS — I10 Essential (primary) hypertension: Secondary | ICD-10-CM | POA: Diagnosis not present

## 2024-02-28 DIAGNOSIS — I5033 Acute on chronic diastolic (congestive) heart failure: Secondary | ICD-10-CM | POA: Diagnosis not present

## 2024-02-28 MED ORDER — FUROSEMIDE 40 MG PO TABS: 40.0000 mg | ORAL_TABLET | Freq: Every day | ORAL | 3 refills | Status: DC

## 2024-02-28 NOTE — Patient Instructions (Addendum)
 Medication Instructions:  Increase lasix  to 40 mg twice a day for the next 3 day then reduce back down to 40 mg once a day  *If you need a refill on your cardiac medications before your next appointment, please call your pharmacy*  Lab Work: In 1 weeks we are going to need you to get a Bmet, BNP, and Mag If you have labs (blood work) drawn today and your tests are completely normal, you will receive your results only by: MyChart Message (if you have MyChart) OR A paper copy in the mail If you have any lab test that is abnormal or we need to change your treatment, we will call you to review the results.  Testing/Procedures: No testing  Follow-Up: At [facility], you and your health needs are our priority.  As part of our continuing mission to provide you with exceptional heart care, our providers are all part of one team.  This team includes your primary Cardiologist (physician) and Advanced Practice Providers or APPs (Physician Assistants and Nurse Practitioners) who all work together to provide you with the care you need, when you need it.  Your next appointment:   1 month(s)  Provider:   Lasalle Pointer, NP  We recommend signing up for the patient portal called "MyChart".  Sign up information is provided on this After Visit Summary.  MyChart is used to connect with patients for Virtual Visits (Telemedicine).  Patients are able to view lab/test results, encounter notes, upcoming appointments, etc.  Non-urgent messages can be sent to your provider as well.   To learn more about what you can do with MyChart, go to [email or web address].   Other Instructions: Remember to keep track of weight at home. Check weight after going to bathroom first thing in the morning.

## 2024-02-28 NOTE — Progress Notes (Signed)
 Cardiology Office Note:  .   Date:  02/28/2024 ID:  [name], DOB Mar 05, 1991, MRN [id number] PCP: Patient, No Pcp Per  [facility] Providers Cardiologist:  Armida Lander, MD    History of Present Illness: .   [name] is a 33 y.o. male with a PMH of CAD, s/p NSTEMI, hypertension, GERD, hx of tobacco abuse, alcohol abuse, marijuana use, possible ? Remote mini strokes, tobacco use, vaping use, who presents today for follow-up.   Hospitalized October 2024 for chest pain.  Troponins peaked at 6,072.  Blood pressures were elevated and heart rate was reported at 30s night before presentation to hospital.  Left heart cath showed 80% stenosis in RCA that was felt to be culprit lesion, treated successfully with PCI/DES.  Also noted to have mild nonobstructive stenosis in distal RCA and distal left circumflex and was treated medically.  Was started on aspirin  and Brilinta , DAPT recommended for 12 months.  ED visit on August 23, 2023 and noted bruising to his abdomen.  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  Supportive care discussed, recommended follow-up with cardiology as planned.  Seen for follow-up on August 28, 2023.  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 Has not been monitoring his BP at home, does not own a home cuff. Says he reports drinking about 12-14 ounces of caffeine each day, also occasionally drinks sodas. Denies any shortness of breath, syncope, presyncope, dizziness, orthopnea, PND, swelling or significant weight changes, acute bleeding, or claudication. Also admits to brief palpitations at times.  ED visit on 11/06/2023 at [facility] for right foot and ankle pain. XR imaging of foot and ankle WNL.   It was suspected that he sprained his ankle and repeated injury prevented it from healing appropriately.  Was placed in splint, conservative measures performed and discussed with patient.   [place] urgent care visit on November 09, 2023 for pain, swelling, and redness of right great toe after stepping on a wooden object.  Patient stated toe developed a blister that he popped over the last 2 days, area began to drain, described a burning sensation.  Was diagnosed with paronychia, possible cellulitis.  He was given ceftriaxone  1 g in office, started on doxycycline  100 mg twice daily for 7 days, mupirocin  ointment and Hibiclens  solution, supportive care recommendations provided and discussed.  11/22/2023 - Since I last saw patient, he tells me he was "left for dead" on 20-Oct-2023 and was in jail for 2 weeks.  Was told he was detoxing for 3 days but was unsure regarding his alcohol use.  Says his jail time was due to relationship issues.  While in jail, he had an episode of chest pain that did not seem to go away, says his memory was fuzzy at that time, but says the pain eventually put him back to sleep, denies any recurrent chest pain since off alcohol and marijuana.  Since he was put in jail, he says he has not been drinking or smoking pot and is currently in a program.  He says now he feels "10 times better and healthwise has been good" since he is no longer drinking or smoking marijuana.  Does admit to current stress, reports getting 6 hours of  sleep when normally he gets 7 to 8 hours of sleep, currently admits to twitching under his left eye.  Denies any red flag signs or symptoms.  Denies any recurrent chest pain, shortness of breath, palpitations, syncope, presyncope, dizziness, orthopnea, PND, swelling or significant weight changes, acute bleeding, or claudication.  He is requesting a refill of duloxetine .  Says he has been all out of this medicine, and has been taking his wife's Prozac in the  meantime, says he has been on Prozac in the past. Does not have a PCP.  He says his right great toe is getting better since he has been receiving treatment for this.  02/28/2024 - Presents today for follow-up with chief concern of weight gain, has had to buy new clothes and admits to leg edema. Weight at last office visit was 221 lbs and today's weight is 265 lbs, weight is up 44 lbs from last office visit. Also says shoes are tighter than they were in January of this year. Does admit to sometimes eating fried foods including fried chicken.  Tells me there are seldom moments where he does forget to take his medicine.  Mother is a Engineer, civil (consulting) and wanted him to be seen for weight gain and was concerned about this.  He tells me he has cut back on smoking.  He is trying to incorporate more healthy foods into his diet but does have some occasional salty foods.  Also has cut back on caffeine and this has improved his palpitations.  Does admit to leg edema.  Admits to peripheral numbness/tingling, noticed along his hands.  He is going to see his PCP soon about this and talk about this. Denies any chest pain, shortness of breath, palpitations, syncope, presyncope, dizziness, orthopnea, PND, acute bleeding, or claudication.  ROS: Negative, See HPI.  FH: Father had bypass before age 75. Maternal GM had 5 heart attacks. SH: Works as a Psychologist, occupational.  Denies any tobacco use.  Drinks alcohol socially, smokes marijuana occasionally. Mother is a Charity fundraiser who used to work at [employer] and has experience working in [employer], now works at [employer].   Studies Reviewed: Aaron Aas    EKG: EKG is not ordered today.  Echo 01/2024:  1. Left ventricular ejection fraction, by estimation, is 50 to 55%. The  left ventricle has low normal function. The left ventricle has no regional  wall motion abnormalities. Left ventricular diastolic parameters were  normal. The average left ventricular   global longitudinal strain is -20.3 %. The global longitudinal strain  is  normal.   2. Right ventricular systolic function is normal. The right ventricular  size is normal. Tricuspid regurgitation signal is inadequate for assessing  PA pressure.   3. The mitral valve is normal in structure. Trivial mitral valve  regurgitation. No evidence of mitral stenosis.   4. The aortic valve is tricuspid. Aortic valve regurgitation is not  visualized. No aortic stenosis is present.   5. The inferior vena cava is normal in size with greater than 50%  respiratory variability, suggesting right atrial pressure of 3 mmHg.   Comparison(s): No prior Echocardiogram.  Left Heart Catheterization 08/06/23: Hemodynamic data: LV: 10/24/1953/-1, EDP 10 mmHg.  Ao 143/105, mean 125 mmHg.  No pressure gradient across the aortic valve.   Angiographic data: RCA: Very large caliber vessel and dominant vessel.  There is mild ectasia noted in the proximal and mid segment.  There is a focal ulcerated 80% stenosis in the proximal segment, about a 30% mid stenosis  and a 30% distal stenosis.  Slow flow is evident throughout the right coronary artery. LM: Very large-caliber vessel, smooth and normal. LAD: Large caliber vessel giving origin to large D1 and several small D2 and D3 vessels, minimal disease is evident in the midsegment. LCx: Large-caliber vessel, continues as a very large OM 3 after giving origin to very small OM1 and 2.  AV groove circumflex is small.  The distal circumflex has a 30-40 % diffuse stenosis.   Intervention data: Successful direct stenting of the proximal RCA lesion with implantation of a 5.0 x 12 mm Synergy XD DES deployed at 14 atmospheric pressure, stenosis reduced from 80% to 0% with TIMI-3 to TIMI-3 flow.  Physical Exam:   VS:  BP 132/80 (BP Location: Left Arm, Cuff Size: Large)   Pulse 82   Ht 5\' 11"  (1.803 m)   Wt 265 lb 9.6 oz (120.5 kg)   SpO2 97%   BMI 37.04 kg/m    Wt Readings from Last 3 Encounters:  02/28/24 265 lb 9.6 oz (120.5 kg)  01/22/24 254 lb  9.6 oz (115.5 kg)  01/17/24 250 lb (113.4 kg)    GEN: Well nourished, well developed in no acute distress, pt is jittery NECK: No JVD; No carotid bruits CARDIAC: S1/S2, RRR, no murmurs, rubs, gallops, 2+ peripheral pulses RESPIRATORY:  Clear to auscultation without rales, wheezing or rhonchi  ABDOMEN: Soft, non-tender, non-distended EXTREMITIES:  Nonpitting edema to BLE; No deformity   ASSESSMENT AND PLAN: .    Acute on chronic HFpEF Stage C, NYHA class I-II symptoms. EF 50-55% 01/2024. Does appear volume up with 44 lbs weight gain since January. Will  increase Lasix  to 40 mg BID x 3 days then reduce Lasix  to 40 mg daily and obtain BMET, proBNP, and Mag in 1 week. No other medication changes at this time. Low sodium diet, fluid restriction <2L, and daily weights encouraged. Educated to contact our office for weight gain of 2 lbs overnight or 5 lbs in one week. At next visit, will see if we can begin Aldactone if BP and labs permit.   CAD, s/p NSTEMI Denies any anginal symptoms since stopping alcohol abuse and marijuana use. No indication for ischemic evaluation. Continue aspirin , atorvastatin , losartan , Brilinta , and nitroglycerin  as needed.  Heart healthy diet and regular cardiovascular exercise encouraged.  Care and ED precautions discussed.   2. HTN Patient has a longstanding history of high blood pressure since 33 years old.  Blood pressure mildly elevated today. Discussed SBP goal is less than 130.  Discussed to monitor BP at home at least 2 hours after medications and sitting for 5-10 minutes.  Continue losartan  and amlodipine . Given salty six diet sheet. Heart healthy diet and regular cardiovascular exercise encouraged.  If SBP is not at goal consistently, plan to stop losartan  and switch to Entresto.   3. HLD Started on atorvastatin  around d/c. Tolerating well.  Has not had lab work that was previously arranged done, pt defers this to his PCP - has upcoming appt.  He verbalized  understanding. Continue current medication regimen. Heart healthy diet and regular cardiovascular exercise encouraged.   4.  Tobacco use, engages in vaping Smoking cessation encouraged and discussed.   5. Peripheral neuropathy Has been ongoing and pt is going to defer this to his PCP. Care and ED precautions discussed.    Dispo: Follow-up with me/APP in 4 weeks or sooner if anything changes.  Signed, Lasalle Pointer, NP

## 2024-03-13 ENCOUNTER — Encounter: Payer: Self-pay | Admitting: Internal Medicine

## 2024-03-13 ENCOUNTER — Ambulatory Visit: Admitting: Internal Medicine

## 2024-03-13 VITALS — BP 138/88 | HR 94 | Ht 71.0 in | Wt 266.6 lb

## 2024-03-13 DIAGNOSIS — F411 Generalized anxiety disorder: Secondary | ICD-10-CM

## 2024-03-13 DIAGNOSIS — I10 Essential (primary) hypertension: Secondary | ICD-10-CM

## 2024-03-13 DIAGNOSIS — Z1159 Encounter for screening for other viral diseases: Secondary | ICD-10-CM

## 2024-03-13 DIAGNOSIS — I5032 Chronic diastolic (congestive) heart failure: Secondary | ICD-10-CM

## 2024-03-13 DIAGNOSIS — E782 Mixed hyperlipidemia: Secondary | ICD-10-CM

## 2024-03-13 DIAGNOSIS — G5603 Carpal tunnel syndrome, bilateral upper limbs: Secondary | ICD-10-CM | POA: Insufficient documentation

## 2024-03-13 DIAGNOSIS — I25118 Atherosclerotic heart disease of native coronary artery with other forms of angina pectoris: Secondary | ICD-10-CM | POA: Diagnosis not present

## 2024-03-13 DIAGNOSIS — J0100 Acute maxillary sinusitis, unspecified: Secondary | ICD-10-CM

## 2024-03-13 DIAGNOSIS — R739 Hyperglycemia, unspecified: Secondary | ICD-10-CM

## 2024-03-13 DIAGNOSIS — I2510 Atherosclerotic heart disease of native coronary artery without angina pectoris: Secondary | ICD-10-CM | POA: Insufficient documentation

## 2024-03-13 MED ORDER — FLUTICASONE PROPIONATE 50 MCG/ACT NA SUSP: 2.0000 | Freq: Every day | NASAL | 1 refills | Status: DC

## 2024-03-13 MED ORDER — FLUTICASONE PROPIONATE 50 MCG/ACT NA SUSP
2.0000 | Freq: Every day | NASAL | 1 refills | Status: AC
Start: 2024-03-13 — End: ?

## 2024-03-13 MED ORDER — DULOXETINE HCL 30 MG PO CPEP: 30.0000 mg | ORAL_CAPSULE | Freq: Two times a day (BID) | ORAL | 3 refills | Status: DC

## 2024-03-13 MED ORDER — AMOXICILLIN-POT CLAVULANATE 875-125 MG PO TABS: 1.0000 | ORAL_TABLET | Freq: Two times a day (BID) | ORAL | 0 refills | Status: DC

## 2024-03-13 NOTE — Assessment & Plan Note (Addendum)
 BMI Readings from Last 3 Encounters:  03/13/24 37.18 kg/m  02/28/24 37.04 kg/m  01/22/24 35.51 kg/m   Associated with HTN, CAD and CHF Advised to follow DASH diet and perform moderate exercise/walking at least 150 minutes/week Would benefit from GLP-1 agonist therapy, especially with history of CAD (NSTEMI), will discuss further in the next visit

## 2024-03-13 NOTE — Progress Notes (Signed)
 New Patient Office Visit  Subjective:  Patient ID: [name], male    DOB: [birth year]  Age: 33 y.o. MRN: [id number]  CC:  Chief Complaint  Patient presents with   Establish Care    HPI [name] is a 33 y.o. male with past medical history of CAD status post stent placement, HFpEF, HTN, HLD and GAD who presents for establishing care.  CAD s/p stent placement: He had NSTEMI in 11/24, had stent placement.  He is currently on DAPT with aspirin  and Plavix .  He also takes atorvastatin  80 mg QD.  Followed by cardiology.  Denies any chest pain, dyspnea or palpitations currently.  HTN: His BP is still borderline elevated despite taking amlodipine  10 mg QD, losartan  100 mg QD and Lasix  40 mg QD.  He has chronic leg swelling due to HFpEF.  Denies any headache, dizziness, or chest pain currently.  HLD: He takes atorvastatin  80 mg QD, due for lipid profile check.  GAD: He reports feeling nervous and has hand and leg tremors due to anxiety.  He had responded well to Cymbalta  in the past, and prefers to start it again.  Denies anhedonia, SI or HI currently.  Carpal tunnel syndrome: He reports bilateral hand numbness and weakness.  He has mild wrist pain as well.  He reports nasal congestion, postnasal drip and sinus pressure related headache for the last 10 days.  He has tried taking OTC antihistaminic without much relief.  Denies any fever, chills, dyspnea or wheezing currently.   Past Medical History:  Diagnosis Date   HTN (hypertension)    Hyperlipidemia    MI (myocardial infarction) (HCC) 08/05/2023    Past Surgical History:  Procedure Laterality Date   CORONARY STENT INTERVENTION N/A 08/06/2023   Procedure: CORONARY STENT INTERVENTION;  Surgeon: Knox Perl, MD;  Location: [facility];  Service: Cardiovascular;  Laterality: N/A;   LEFT HEART CATH AND CORONARY ANGIOGRAPHY N/A 08/06/2023   Procedure: LEFT HEART CATH AND CORONARY ANGIOGRAPHY;  Surgeon: Knox Perl, MD;   Location: [facility];  Service: Cardiovascular;  Laterality: N/A;    Family History  Problem Relation Age of Onset   Heart attack Paternal Grandfather 56       CABG    Social History   Socioeconomic History   Marital status: Married    Spouse name: Not on file   Number of children: Not on file   Years of education: Not on file   Highest education level: Not on file  Occupational History   Not on file  Tobacco Use   Smoking status: Former    Current packs/day: 0.50    Average packs/day: 0.5 packs/day for 21.0 years (10.5 ttl pk-yrs)    Types: Cigarettes    Start date: 03/21/2003   Smokeless tobacco: Never  Vaping Use   Vaping status: Every Day   Substances: Nicotine  Substance and Sexual Activity   Alcohol use: Not Currently    Comment: occ   Drug use: Not Currently    Types: Marijuana   Sexual activity: Not on file  Other Topics Concern   Not on file  Social History Narrative   Not on file   Social Drivers of Health   Financial Resource Strain: Not on file  Food Insecurity: Not on file  Transportation Needs: Not on file  Physical Activity: Not on file  Stress: Not on file  Social Connections: Not on file  Intimate Partner Violence: Not on file  ROS Review of Systems  Constitutional:  Negative for chills and fever.  HENT:  Positive for congestion, sinus pressure and sinus pain. Negative for sore throat.   Eyes:  Negative for pain and discharge.  Respiratory:  Negative for cough and shortness of breath.   Cardiovascular:  Negative for chest pain and palpitations.  Gastrointestinal:  Negative for constipation, diarrhea, nausea and vomiting.  Endocrine: Negative for polydipsia and polyuria.  Genitourinary:  Negative for dysuria and hematuria.  Musculoskeletal:  Negative for neck pain and neck stiffness.  Skin:  Negative for rash.  Neurological:  Positive for weakness (B/l hands) and numbness (B/l hands). Negative for dizziness and headaches.   Psychiatric/Behavioral:  Negative for agitation and behavioral problems.     Objective:   Today's Vitals: BP 138/88 (BP Location: Left Arm)   Pulse 94   Ht 5\' 11"  (1.803 m)   Wt 266 lb 9.6 oz (120.9 kg)   SpO2 95%   BMI 37.18 kg/m   Physical Exam Vitals reviewed.  Constitutional:      General: He is not in acute distress.    Appearance: He is not diaphoretic.  HENT:     Head: Normocephalic and atraumatic.     Nose:     Right Sinus: Maxillary sinus tenderness present.     Left Sinus: Maxillary sinus tenderness present.     Mouth/Throat:     Mouth: Mucous membranes are moist.  Eyes:     General: No scleral icterus.    Extraocular Movements: Extraocular movements intact.     Pupils: Pupils are equal, round, and reactive to light.  Cardiovascular:     Rate and Rhythm: Normal rate and regular rhythm.     Heart sounds: Normal heart sounds. No murmur heard. Pulmonary:     Breath sounds: Normal breath sounds. No wheezing or rales.  Musculoskeletal:     Cervical back: Neck supple. No tenderness.     Right lower leg: Edema (1+) present.     Left lower leg: Edema (1+) present.     Comments: Phalen sign positive  Skin:    General: Skin is warm.     Findings: No rash.  Neurological:     General: No focal deficit present.     Mental Status: He is alert and oriented to person, place, and time.  Psychiatric:        Mood and Affect: Mood normal.        Behavior: Behavior normal.     Assessment & Plan:   Problem List Items Addressed This Visit       Cardiovascular and Mediastinum   Essential hypertension   BP Readings from Last 1 Encounters:  03/13/24 138/88   Uncontrolled with amlodipine  10 mg QD and losartan  100 mg QD Cardiology planning to start spironolactone, check BMP Counseled for compliance with the medications Advised DASH diet and moderate exercise/walking, at least 150 mins/week       Relevant Orders   CMP14+EGFR   TSH   CAD s/p stent placement -  Primary   On DAPT and statin On losartan  Denies chest pain currently Followed by cardiology      Relevant Orders   CMP14+EGFR   TSH   Chronic heart failure with preserved ejection fraction (HCC)   On losartan  100 mg QD On Lasix  40 mg QD Reports recently gaining 40 lbs in 3 months, unclear if related to fluid versus caloric intake Advised to avoid soft drinks, limit sodium intake to maximum 2 g in  a day and perform leg elevation for leg swelling Planned to start spironolactone, followed by cardiology        Respiratory   Acute non-recurrent maxillary sinusitis   Started empiric Augmentin as he has persistent symptoms despite symptomatic treatment Flonase for nasal congestion Advised to use vaporizer and/or a sinus inhaler for nasal congestion      Relevant Medications   amoxicillin-clavulanate (AUGMENTIN) 875-125 MG tablet   fluticasone (FLONASE) 50 MCG/ACT nasal spray     Nervous and Auditory   Bilateral carpal tunnel syndrome   Bilateral hand numbness and tingling with weakness likely due to carpal tunnel syndrome Advised to try wrist brace for now Once he completes DAPT for 1 year, can refer to Hand surgeon for considering operative repair      Relevant Medications   DULoxetine  (CYMBALTA ) 30 MG capsule     Other   Hyperlipidemia   On Lipitor 80 mg QD Check lipid profile      Relevant Orders   Lipid Profile   GAD (generalized anxiety disorder)   Uncontrolled Started Cymbalta  30 mg BID If persistent anxiety, can adjust dose of Cymbalta  and/or add Buspar      Relevant Medications   DULoxetine  (CYMBALTA ) 30 MG capsule   Morbid obesity (HCC)   BMI Readings from Last 3 Encounters:  03/13/24 37.18 kg/m  02/28/24 37.04 kg/m  01/22/24 35.51 kg/m   Associated with HTN, CAD and CHF Advised to follow DASH diet and perform moderate exercise/walking at least 150 minutes/week Would benefit from GLP-1 agonist therapy, especially with history of CAD (NSTEMI), will  discuss further in the next visit       Other Visit Diagnoses       Hyperglycemia       Relevant Orders   CMP14+EGFR   Hemoglobin A1c     Need for hepatitis C screening test       Relevant Orders   Hepatitis C Antibody       Outpatient Encounter Medications as of 03/13/2024  Medication Sig   amLODipine  (NORVASC ) 10 MG tablet Take 1 tablet (10 mg total) by mouth daily.   aspirin  EC 81 MG tablet Take 1 tablet (81 mg total) by mouth daily. Swallow whole.   atorvastatin  (LIPITOR) 80 MG tablet Take 1 tablet (80 mg total) by mouth daily.   clopidogrel  (PLAVIX ) 75 MG tablet Take 4 tablets (300 mg total) by mouth as directed. Take 300 mg for first dose on day one, then 75 mg daily afterwards   furosemide  (LASIX ) 40 MG tablet Take 1 tablet (40 mg total) by mouth daily. Take 1 tablet twice a day for the next three days then go down to 1 tablet once a day   losartan  (COZAAR ) 100 MG tablet Take 1 tablet (100 mg total) by mouth daily.   nitroGLYCERIN  (NITROSTAT ) 0.4 MG SL tablet Place 0.4 mg under the tongue every 5 (five) minutes x 3 doses as needed for chest pain (if no relief after 2nd dose, proceed to ED or call 911).   pantoprazole  (PROTONIX ) 40 MG tablet Take 1 tablet (40 mg total) by mouth daily.   [DISCONTINUED] amoxicillin-clavulanate (AUGMENTIN) 875-125 MG tablet Take 1 tablet by mouth 2 (two) times daily.   [DISCONTINUED] DULoxetine  (CYMBALTA ) 30 MG capsule Take 1 capsule (30 mg total) by mouth 2 (two) times daily.   [DISCONTINUED] fluticasone (FLONASE) 50 MCG/ACT nasal spray Place 2 sprays into both nostrils daily.   amoxicillin-clavulanate (AUGMENTIN) 875-125 MG tablet Take 1 tablet by mouth  2 (two) times daily.   Blood Pressure Monitor DEVI 1 each by Does not apply route daily. Adult size large electric omron cuff   DULoxetine  (CYMBALTA ) 30 MG capsule Take 1 capsule (30 mg total) by mouth 2 (two) times daily.   fluticasone (FLONASE) 50 MCG/ACT nasal spray Place 2 sprays into both  nostrils daily.   No facility-administered encounter medications on file as of 03/13/2024.    Follow-up: Return in about 4 months (around 07/14/2024) for HTN and CAD.   Meldon Sport, MD

## 2024-03-13 NOTE — Assessment & Plan Note (Signed)
 Uncontrolled Started Cymbalta  30 mg BID If persistent anxiety, can adjust dose of Cymbalta  and/or add Buspar

## 2024-03-13 NOTE — Assessment & Plan Note (Signed)
 On DAPT and statin On losartan  Denies chest pain currently Followed by cardiology

## 2024-03-13 NOTE — Assessment & Plan Note (Signed)
 Started empiric Augmentin as he has persistent symptoms despite symptomatic treatment Flonase for nasal congestion Advised to use vaporizer and/or a sinus inhaler for nasal congestion

## 2024-03-13 NOTE — Assessment & Plan Note (Signed)
On Lipitor 80 mg QD Check lipid profile 

## 2024-03-13 NOTE — Assessment & Plan Note (Signed)
 On losartan  100 mg QD On Lasix  40 mg QD Reports recently gaining 40 lbs in 3 months, unclear if related to fluid versus caloric intake Advised to avoid soft drinks, limit sodium intake to maximum 2 g in a day and perform leg elevation for leg swelling Planned to start spironolactone, followed by cardiology

## 2024-03-13 NOTE — Assessment & Plan Note (Signed)
 BP Readings from Last 1 Encounters:  03/13/24 138/88   Uncontrolled with amlodipine  10 mg QD and losartan  100 mg QD Cardiology planning to start spironolactone, check BMP Counseled for compliance with the medications Advised DASH diet and moderate exercise/walking, at least 150 mins/week

## 2024-03-13 NOTE — Patient Instructions (Addendum)
 Please start taking Augmentin  as prescribed.  Please use Flonase  for nasal congestion.  Please continue to take medications as prescribed.  Please continue to follow low carb diet and perform moderate exercise/walking at least 150 mins/week.  Please use carpal tunnel brace for hand numbness and tingling.

## 2024-03-13 NOTE — Assessment & Plan Note (Signed)
 Bilateral hand numbness and tingling with weakness likely due to carpal tunnel syndrome Advised to try wrist brace for now Once he completes DAPT for 1 year, can refer to Hand surgeon for considering operative repair

## 2024-03-14 ENCOUNTER — Ambulatory Visit: Payer: Self-pay | Admitting: Internal Medicine

## 2024-03-14 LAB — CMP14+EGFR
ALT: 47 IU/L — ABNORMAL HIGH (ref 0–44)
AST: 26 IU/L (ref 0–40)
Albumin: 4.6 g/dL (ref 4.1–5.1)
Alkaline Phosphatase: 100 IU/L (ref 44–121)
BUN/Creatinine Ratio: 11 (ref 9–20)
BUN: 11 mg/dL (ref 6–20)
Bilirubin Total: 0.3 mg/dL (ref 0.0–1.2)
CO2: 24 mmol/L (ref 20–29)
Calcium: 9.8 mg/dL (ref 8.7–10.2)
Chloride: 104 mmol/L (ref 96–106)
Creatinine, Ser: 0.99 mg/dL (ref 0.76–1.27)
Globulin, Total: 2.4 g/dL (ref 1.5–4.5)
Glucose: 91 mg/dL (ref 70–99)
Potassium: 4.6 mmol/L (ref 3.5–5.2)
Sodium: 142 mmol/L (ref 134–144)
Total Protein: 7 g/dL (ref 6.0–8.5)
eGFR: 103 mL/min/{1.73_m2} (ref >59)

## 2024-03-14 LAB — TSH: TSH: 3.26 u[IU]/mL (ref 0.450–4.500)

## 2024-03-14 LAB — HEPATITIS C ANTIBODY: Hep C Virus Ab: NONREACTIVE

## 2024-03-14 LAB — LIPID PANEL
Chol/HDL Ratio: 4 ratio (ref 0.0–5.0)
Cholesterol, Total: 143 mg/dL (ref 100–199)
HDL: 36 mg/dL — ABNORMAL LOW (ref >39)
LDL Chol Calc (NIH): 82 mg/dL (ref 0–99)
Triglycerides: 138 mg/dL (ref 0–149)
VLDL Cholesterol Cal: 25 mg/dL (ref 5–40)

## 2024-03-14 LAB — HEMOGLOBIN A1C
Est. average glucose Bld gHb Est-mCnc: 123 mg/dL
Hgb A1c MFr Bld: 5.9 % — ABNORMAL HIGH (ref 4.8–5.6)

## 2024-05-08 ENCOUNTER — Ambulatory Visit: Attending: Nurse Practitioner | Admitting: Nurse Practitioner

## 2024-05-08 ENCOUNTER — Encounter: Payer: Self-pay | Admitting: Nurse Practitioner

## 2024-05-08 VITALS — BP 130/70 | HR 90 | Ht 71.0 in | Wt 264.0 lb

## 2024-05-08 DIAGNOSIS — I10 Essential (primary) hypertension: Secondary | ICD-10-CM | POA: Diagnosis not present

## 2024-05-08 DIAGNOSIS — Z7289 Other problems related to lifestyle: Secondary | ICD-10-CM

## 2024-05-08 DIAGNOSIS — R609 Edema, unspecified: Secondary | ICD-10-CM

## 2024-05-08 DIAGNOSIS — Z720 Tobacco use: Secondary | ICD-10-CM

## 2024-05-08 DIAGNOSIS — E785 Hyperlipidemia, unspecified: Secondary | ICD-10-CM

## 2024-05-08 DIAGNOSIS — I2510 Atherosclerotic heart disease of native coronary artery without angina pectoris: Secondary | ICD-10-CM

## 2024-05-08 MED ORDER — SPIRONOLACTONE 25 MG PO TABS: 25.0000 mg | ORAL_TABLET | Freq: Every day | ORAL | 1 refills | Status: DC

## 2024-05-08 NOTE — Progress Notes (Unsigned)
 Cardiology Office Note:  .   Date:  05/08/2024 ID:  Redell [name], DOB [birth year], MRN [id number] PCP: [name], MD  [facility] Providers Cardiologist:  Alvan Carrier, MD    History of Present Illness: .   [name] is a 33 y.o. male with a PMH of CAD, s/p NSTEMI, hypertension, GERD, hx of tobacco abuse, alcohol abuse, marijuana use, possible ? Remote mini strokes, tobacco use, vaping use, who presents today for follow-up.   Hospitalized October 2024 for chest pain.  Troponins peaked at 6,072.  Blood pressures were elevated and heart rate was reported at 30s night before presentation to hospital.  Left heart cath showed 80% stenosis in RCA that was felt to be culprit lesion, treated successfully with PCI/DES.  Also noted to have mild nonobstructive stenosis in distal RCA and distal left circumflex and was treated medically.  Was started on aspirin  and Brilinta , DAPT recommended for 12 months.  ED visit on August 23, 2023 and noted bruising to his abdomen.  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  Supportive care discussed, recommended follow-up with cardiology as planned.  Seen for follow-up on August 28, 2023.  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 Has not been monitoring his BP at home, does not own a home cuff. Says he reports drinking about 12-14 ounces of caffeine each day, also occasionally drinks sodas. Denies any shortness of breath, syncope, presyncope, dizziness, orthopnea, PND, swelling or significant weight changes, acute bleeding, or claudication. Also admits to brief palpitations at times.  ED visit on 11/06/2023 at [facility] for right foot and ankle pain. XR imaging of foot and ankle WNL.   It was suspected that he sprained his ankle and repeated injury prevented it from healing appropriately.  Was placed in splint, conservative measures performed and discussed with patient.   [facility] urgent care visit on November 09, 2023 for pain, swelling, and redness of right great toe after stepping on a wooden object.  Patient stated toe developed a blister that he popped over the last 2 days, area began to drain, described a burning sensation.  Was diagnosed with paronychia, possible cellulitis.  He was given ceftriaxone  1 g in office, started on doxycycline  100 mg twice daily for 7 days, mupirocin  ointment and Hibiclens  solution, supportive care recommendations provided and discussed.  11/22/2023 - Since I last saw patient, he tells me he was left for dead on 10/30/23 and was in jail for 2 weeks.  Was told he was detoxing for 3 days but was unsure regarding his alcohol use.  Says his jail time was due to relationship issues.  While in jail, he had an episode of chest pain that did not seem to go away, says his memory was fuzzy at that time, but says the pain eventually put him back to sleep, denies any recurrent chest pain since off alcohol and marijuana.  Since he was put in jail, he says he has not been drinking or smoking pot and is currently in a program.  He says now he feels 10 times better and healthwise has been good since he is no longer drinking or smoking marijuana.  Does admit to current stress, reports getting 6 hours of  sleep when normally he gets 7 to 8 hours of sleep, currently admits to twitching under his left eye.  Denies any red flag signs or symptoms.  Denies any recurrent chest pain, shortness of breath, palpitations, syncope, presyncope, dizziness, orthopnea, PND, swelling or significant weight changes, acute bleeding, or claudication.  He is requesting a refill of duloxetine .  Says he has been all out of this medicine, and has been taking his wife's Prozac in the  meantime, says he has been on Prozac in the past. Does not have a PCP.  He says his right great toe is getting better since he has been receiving treatment for this.  02/28/2024 - Presents today for follow-up with chief concern of weight gain, has had to buy new clothes and admits to leg edema. Weight at last office visit was 221 lbs and today's weight is 265 lbs, weight is up 44 lbs from last office visit. Also says shoes are tighter than they were in January of this year. Does admit to sometimes eating fried foods including fried chicken.  Tells me there are seldom moments where he does forget to take his medicine.  Mother is a Engineer, civil (consulting) and wanted him to be seen for weight gain and was concerned about this.  He tells me he has cut back on smoking.  He is trying to incorporate more healthy foods into his diet but does have some occasional salty foods.  Also has cut back on caffeine and this has improved his palpitations.  Does admit to leg edema.  Admits to peripheral numbness/tingling, noticed along his hands.  He is going to see his PCP soon about this and talk about this. Denies any chest pain, shortness of breath, palpitations, syncope, presyncope, dizziness, orthopnea, PND, acute bleeding, or claudication.  05/08/2024 -  Here for follow-up. Weight remains the same despite taking Lasix . Does endorse possible intake of salt. Denies any chest pain, shortness of breath, palpitations, syncope, presyncope, dizziness, orthopnea, PND, swelling or significant weight changes, acute bleeding, or claudication. No real change in symptoms since last office visit.   ROS: Negative, See HPI.  FH: Father had bypass before age 12. Maternal GM had 5 heart attacks. SH: Works as a Psychologist, occupational.  Denies any tobacco use.  Drinks alcohol socially, smokes marijuana occasionally. Mother is a Charity fundraiser who used to work at [employer] and has experience working in [employer], now works at [employer].   Studies Reviewed: SABRA    EKG: EKG is not ordered  today.  Echo 01/2024:  1. Left ventricular ejection fraction, by estimation, is 50 to 55%. The  left ventricle has low normal function. The left ventricle has no regional  wall motion abnormalities. Left ventricular diastolic parameters were  normal. The average left ventricular   global longitudinal strain is -20.3 %. The global longitudinal strain is  normal.   2. Right ventricular systolic function is normal. The right ventricular  size is normal. Tricuspid regurgitation signal is inadequate for assessing  PA pressure.   3. The mitral valve is normal in structure. Trivial mitral valve  regurgitation. No evidence of mitral stenosis.   4. The aortic valve is tricuspid. Aortic valve regurgitation is not  visualized. No aortic stenosis is present.   5. The inferior vena cava is normal in size with greater than 50%  respiratory variability, suggesting right atrial pressure of 3 mmHg.   Comparison(s): No prior Echocardiogram.  Left Heart Catheterization 08/06/23: Hemodynamic data: LV: 10/24/1953/-1, EDP 10 mmHg.  Ao 143/105,  mean 125 mmHg.  No pressure gradient across the aortic valve.   Angiographic data: RCA: Very large caliber vessel and dominant vessel.  There is mild ectasia noted in the proximal and mid segment.  There is a focal ulcerated 80% stenosis in the proximal segment, about a 30% mid stenosis and a 30% distal stenosis.  Slow flow is evident throughout the right coronary artery. LM: Very large-caliber vessel, smooth and normal. LAD: Large caliber vessel giving origin to large D1 and several small D2 and D3 vessels, minimal disease is evident in the midsegment. LCx: Large-caliber vessel, continues as a very large OM 3 after giving origin to very small OM1 and 2.  AV groove circumflex is small.  The distal circumflex has a 30-40 % diffuse stenosis.   Intervention data: Successful direct stenting of the proximal RCA lesion with implantation of a 5.0 x 12 mm Synergy XD DES  deployed at 14 atmospheric pressure, stenosis reduced from 80% to 0% with TIMI-3 to TIMI-3 flow.  Physical Exam:   VS:  BP 130/70   Pulse 90   Ht 5' 11 (1.803 m)   Wt 264 lb (119.7 kg)   SpO2 98%   BMI 36.82 kg/m    Wt Readings from Last 3 Encounters:  05/08/24 264 lb (119.7 kg)  03/13/24 266 lb 9.6 oz (120.9 kg)  02/28/24 265 lb 9.6 oz (120.5 kg)    GEN: Well nourished, well developed in no acute distress, pt is jittery NECK: No JVD; No carotid bruits CARDIAC: S1/S2, RRR, no murmurs, rubs, gallops, 2+ peripheral pulses RESPIRATORY:  Clear to auscultation without rales, wheezing or rhonchi  ABDOMEN: Soft, non-tender, non-distended EXTREMITIES:  Nonpitting edema to BLE; No deformity   ASSESSMENT AND PLAN: .    Fluid retention, possible CHF?   NYHA class I-II symptoms. EF 50-55% 01/2024.  Weight is stable since last office visit.  He does show signs of fluid retention.  Does appear volume up with 44 lbs weight gain since January.  Will begin spironolactone  25 mg daily.  Continue Lasix .  Will obtain BMET, proBNP, and Mag in 1 week. No other medication changes at this time. Low sodium diet, fluid restriction <2L, and daily weights encouraged. Educated to contact our office for weight gain of 2 lbs overnight or 5 lbs in one week.   CAD, s/p NSTEMI Denies any anginal symptoms since stopping alcohol abuse and marijuana use. No indication for ischemic evaluation. Continue aspirin , atorvastatin , losartan , Plavix , and nitroglycerin  as needed.  Heart healthy diet and regular cardiovascular exercise encouraged.  Care and ED precautions discussed.   2. HTN  Blood pressure borderline elevated today. Discussed SBP goal is less than 130.  Discussed to monitor BP at home at least 2 hours after medications and sitting for 5-10 minutes.  Beginning spironolactone  25 mg daily as noted above.  Checking labs as mentioned earlier.  Continue losartan  and amlodipine . Given salty six diet sheet. Heart healthy  diet and regular cardiovascular exercise encouraged.  If SBP is not at goal consistently, plan to stop losartan  and switch to Entresto.   3. HLD Most recent LDL 82 in May 2025.  Continue atorvastatin . Heart healthy diet and regular cardiovascular exercise encouraged.   4.  Tobacco use, engages in vaping Smoking cessation encouraged and discussed.    Dispo: Follow-up with me/APP in 4-6 weeks or sooner if anything changes.  Signed, Almarie Crate, NP

## 2024-05-08 NOTE — Patient Instructions (Addendum)
 Medication Instructions:  Your physician has recommended you make the following change in your medication:  Please start Spironolactone  25 Mg daily   Labwork: In 1-2 weeks   Testing/Procedures: None   Follow-Up: Your physician recommends that you schedule a follow-up appointment in: 4-6 weeks   Any Other Special Instructions Will Be Listed Below (If Applicable).  If you need a refill on your cardiac medications before your next appointment, please call your pharmacy.

## 2024-06-17 ENCOUNTER — Ambulatory Visit: Attending: Nurse Practitioner | Admitting: Nurse Practitioner

## 2024-06-17 ENCOUNTER — Encounter: Payer: Self-pay | Admitting: Nurse Practitioner

## 2024-06-17 VITALS — BP 122/70 | HR 88 | Ht 71.0 in | Wt 266.8 lb

## 2024-06-17 DIAGNOSIS — R635 Abnormal weight gain: Secondary | ICD-10-CM | POA: Diagnosis not present

## 2024-06-17 DIAGNOSIS — R609 Edema, unspecified: Secondary | ICD-10-CM | POA: Diagnosis not present

## 2024-06-17 DIAGNOSIS — I10 Essential (primary) hypertension: Secondary | ICD-10-CM

## 2024-06-17 DIAGNOSIS — Z720 Tobacco use: Secondary | ICD-10-CM

## 2024-06-17 DIAGNOSIS — E785 Hyperlipidemia, unspecified: Secondary | ICD-10-CM

## 2024-06-17 DIAGNOSIS — I5032 Chronic diastolic (congestive) heart failure: Secondary | ICD-10-CM

## 2024-06-17 DIAGNOSIS — I2510 Atherosclerotic heart disease of native coronary artery without angina pectoris: Secondary | ICD-10-CM

## 2024-06-17 DIAGNOSIS — I5033 Acute on chronic diastolic (congestive) heart failure: Secondary | ICD-10-CM

## 2024-06-17 DIAGNOSIS — Z7289 Other problems related to lifestyle: Secondary | ICD-10-CM

## 2024-06-17 NOTE — Progress Notes (Unsigned)
 Cardiology Office Note:  .   Date:  06/17/2024 ID:  [name], DOB 10-Jul-1991, MRN [id number] PCP: [name], MD  [facility] Providers Cardiologist:  Alvan Carrier, MD    History of Present Illness: .   [name] is a 33 y.o. male with a PMH of CAD, s/p NSTEMI, hypertension, GERD, hx of tobacco abuse, alcohol abuse, marijuana use, possible ? Remote mini strokes, tobacco use, vaping use, who presents today for follow-up.   Hospitalized October 2024 for chest pain.  Troponins peaked at 6,072.  Blood pressures were elevated and heart rate was reported at 30s night before presentation to hospital.  Left heart cath showed 80% stenosis in RCA that was felt to be culprit lesion, treated successfully with PCI/DES.  Also noted to have mild nonobstructive stenosis in distal RCA and distal left circumflex and was treated medically.  Was started on aspirin  and Brilinta , DAPT recommended for 12 months.  ED visit on August 23, 2023 and noted bruising to his abdomen.  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  Supportive care discussed, recommended follow-up with cardiology as planned.  Seen for follow-up on August 28, 2023.  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 Has not been monitoring his BP at home, does not own a home cuff. Says he reports drinking about 12-14 ounces of caffeine each day, also occasionally drinks sodas. Denies any shortness of breath, syncope, presyncope, dizziness, orthopnea, PND, swelling or significant weight changes, acute bleeding, or claudication. Also admits to brief palpitations at times.  ED visit on 11/06/2023 at [facility] for right foot and ankle pain. XR imaging of foot and ankle WNL.   It was suspected that he sprained his ankle and repeated injury prevented it from healing appropriately.  Was placed in splint, conservative measures performed and discussed with patient.   [facility] urgent care visit on November 09, 2023 for pain, swelling, and redness of right great toe after stepping on a wooden object.  Patient stated toe developed a blister that he popped over the last 2 days, area began to drain, described a burning sensation.  Was diagnosed with paronychia, possible cellulitis.  He was given ceftriaxone  1 g in office, started on doxycycline  100 mg twice daily for 7 days, mupirocin  ointment and Hibiclens  solution, supportive care recommendations provided and discussed.  11/22/2023 - Since I last saw patient, he tells me he was left for dead on 2023-10-18 and was in jail for 2 weeks.  Was told he was detoxing for 3 days but was unsure regarding his alcohol use.  Says his jail time was due to relationship issues.  While in jail, he had an episode of chest pain that did not seem to go away, says his memory was fuzzy at that time, but says the pain eventually put him back to sleep, denies any recurrent chest pain since off alcohol and marijuana.  Since he was put in jail, he says he has not been drinking or smoking pot and is currently in a program.  He says now he feels 10 times better and healthwise has been good since he is no longer drinking or smoking marijuana.  Does admit to current stress, reports getting 6 hours of  sleep when normally he gets 7 to 8 hours of sleep, currently admits to twitching under his left eye.  Denies any red flag signs or symptoms.  Denies any recurrent chest pain, shortness of breath, palpitations, syncope, presyncope, dizziness, orthopnea, PND, swelling or significant weight changes, acute bleeding, or claudication.  He is requesting a refill of duloxetine .  Says he has been all out of this medicine, and has been taking his wife's Prozac in the  meantime, says he has been on Prozac in the past. Does not have a PCP.  He says his right great toe is getting better since he has been receiving treatment for this.  02/28/2024 - Presents today for follow-up with chief concern of weight gain, has had to buy new clothes and admits to leg edema. Weight at last office visit was 221 lbs and today's weight is 265 lbs, weight is up 44 lbs from last office visit. Also says shoes are tighter than they were in January of this year. Does admit to sometimes eating fried foods including fried chicken.  Tells me there are seldom moments where he does forget to take his medicine.  Mother is a Engineer, civil (consulting) and wanted him to be seen for weight gain and was concerned about this.  He tells me he has cut back on smoking.  He is trying to incorporate more healthy foods into his diet but does have some occasional salty foods.  Also has cut back on caffeine and this has improved his palpitations.  Does admit to leg edema.  Admits to peripheral numbness/tingling, noticed along his hands.  He is going to see his PCP soon about this and talk about this. Denies any chest pain, shortness of breath, palpitations, syncope, presyncope, dizziness, orthopnea, PND, acute bleeding, or claudication.  05/08/2024 -  Here for follow-up. Weight remains the same despite taking Lasix . Does endorse possible intake of salt. Denies any chest pain, shortness of breath, palpitations, syncope, presyncope, dizziness, orthopnea, PND, swelling or significant weight changes, acute bleeding, or claudication. No real change in symptoms since last office visit.   06/17/2024 - Here for follow-up.  Tells me he weighs himself at work and his weight is down 12 to 14 pounds since last office visit.  He is drinking more water and decreasing his caffeine intake.  He also says he is exercising more during break at work.  His swelling has improved along his legs per his report.  Also admits to some left upper back pain and wonders  if something is wrong with his kidneys. Overall doing well. Denies any chest pain, shortness of breath, palpitations, syncope, presyncope, dizziness, orthopnea, PND, swelling or significant weight changes, acute bleeding, or claudication.  He has not had labs done since last office visit.  ROS: Negative, See HPI.  FH: Father had bypass before age 31. Maternal GM had 5 heart attacks. SH: Works as a Psychologist, occupational.  Denies any tobacco use.  Drinks alcohol socially, smokes marijuana occasionally. Mother is a Charity fundraiser who used to work at [employer] and has experience working in [employer], now works at [employer].   Studies Reviewed: SABRA    EKG: EKG is not ordered today.  Echo 01/2024:  1. Left ventricular ejection fraction, by estimation, is 50 to 55%. The  left ventricle has low normal function. The left ventricle has no regional  wall motion abnormalities. Left ventricular diastolic parameters were  normal. The average left ventricular   global longitudinal strain is -20.3 %. The global longitudinal strain  is  normal.   2. Right ventricular systolic function is normal. The right ventricular  size is normal. Tricuspid regurgitation signal is inadequate for assessing  PA pressure.   3. The mitral valve is normal in structure. Trivial mitral valve  regurgitation. No evidence of mitral stenosis.   4. The aortic valve is tricuspid. Aortic valve regurgitation is not  visualized. No aortic stenosis is present.   5. The inferior vena cava is normal in size with greater than 50%  respiratory variability, suggesting right atrial pressure of 3 mmHg.   Comparison(s): No prior Echocardiogram.  Left Heart Catheterization 08/06/23: Hemodynamic data: LV: 10/24/1953/-1, EDP 10 mmHg.  Ao 143/105, mean 125 mmHg.  No pressure gradient across the aortic valve.   Angiographic data: RCA: Very large caliber vessel and dominant vessel.  There is mild ectasia noted in the proximal and mid segment.  There is a focal ulcerated 80%  stenosis in the proximal segment, about a 30% mid stenosis and a 30% distal stenosis.  Slow flow is evident throughout the right coronary artery. LM: Very large-caliber vessel, smooth and normal. LAD: Large caliber vessel giving origin to large D1 and several small D2 and D3 vessels, minimal disease is evident in the midsegment. LCx: Large-caliber vessel, continues as a very large OM 3 after giving origin to very small OM1 and 2.  AV groove circumflex is small.  The distal circumflex has a 30-40 % diffuse stenosis.   Intervention data: Successful direct stenting of the proximal RCA lesion with implantation of a 5.0 x 12 mm Synergy XD DES deployed at 14 atmospheric pressure, stenosis reduced from 80% to 0% with TIMI-3 to TIMI-3 flow.  Physical Exam:   VS:  BP 122/70   Pulse 88   Ht 5' 11 (1.803 m)   Wt 266 lb 12.8 oz (121 kg)   SpO2 99%   BMI 37.21 kg/m    Wt Readings from Last 3 Encounters:  06/17/24 266 lb 12.8 oz (121 kg)  05/08/24 264 lb (119.7 kg)  03/13/24 266 lb 9.6 oz (120.9 kg)    GEN: Obese, 33 year old male in no acute distress, pt is jittery NECK: No JVD; No carotid bruits CARDIAC: S1/S2, RRR, no murmurs, rubs, gallops, 2+ peripheral pulses RESPIRATORY:  Clear to auscultation without rales, wheezing or rhonchi  ABDOMEN: Soft, non-tender, non-distended EXTREMITIES:  Nonpitting edema to BLE; No deformity   ASSESSMENT AND PLAN: .    Fluid retention, possible CHF?, weight gain  NYHA class I-II symptoms. EF 50-55% 01/2024.  Weight is stable since last office visit. However swelling appears better. Significant weight gain since January 2025.  He has not had labs done since previous office visit-discussed with him to get these done.  He verbalized understanding.  No medication changes at this time. Low sodium diet, fluid restriction <2L, and daily weights encouraged. Educated to contact our office for weight gain of 2 lbs overnight or 5 lbs in one week.  Care and ED precautions  discussed.  CAD, s/p NSTEMI Denies any anginal symptoms since stopping alcohol abuse and marijuana use. No indication for ischemic evaluation. Continue current medication regimen.  Heart healthy diet and regular cardiovascular exercise encouraged.  Care and ED precautions discussed.   2. HTN  Blood pressure stable today. Discussed SBP goal is less than 130.  Discussed to monitor BP at home at least 2 hours after medications and sitting for 5-10 minutes.  Continue current medication regimen.  Given salty six diet sheet. Heart healthy diet  and regular cardiovascular exercise encouraged.  3. HLD Most recent LDL 82 in May 2025.  Continue atorvastatin . Heart healthy diet and regular cardiovascular exercise encouraged.   4.  Tobacco use, engages in vaping Smoking cessation encouraged and discussed.   I spent a total duration of 30 minutes reviewing prior notes, reviewing outside records including  labs, face-to-face counseling of medical condition, pathophysiology, evaluation, management, and documenting the findings in the note.    Dispo: Follow-up with MD/APP in 3 months or sooner if anything changes.  Signed, Almarie Crate, NP

## 2024-06-17 NOTE — Patient Instructions (Addendum)
 Medication Instructions:  Your physician recommends that you continue on your current medications as directed. Please refer to the Current Medication list given to you today.  Labwork: By 06/27/24 with [facility]   Testing/Procedures: None   Follow-Up: Your physician recommends that you schedule a follow-up appointment in: 3 Months   Any Other Special Instructions Will Be Listed Below (If Applicable).  If you need a refill on your cardiac medications before your next appointment, please call your pharmacy.      Mediterranean Diet  Why follow it? Research shows. Those who follow the Mediterranean diet have a reduced risk of heart disease  The diet is associated with a reduced incidence of Parkinson's and Alzheimer's diseases People following the diet may have longer life expectancies and lower rates of chronic diseases  The Dietary Guidelines for Americans recommends the Mediterranean diet as an eating plan to promote health and prevent disease  What Is the Mediterranean Diet?  Healthy eating plan based on typical foods and recipes of Mediterranean-style cooking The diet is primarily a plant based diet; these foods should make up a majority of meals   Starches - Plant based foods should make up a majority of meals - They are an important sources of vitamins, minerals, energy, antioxidants, and fiber - Choose whole grains, foods high in fiber and minimally processed items  - Typical grain sources include wheat, oats, barley, corn, brown rice, bulgar, farro, millet, polenta, couscous  - Various types of beans include chickpeas, lentils, fava beans, black beans, white beans   Fruits  Veggies - Large quantities of antioxidant rich fruits & veggies; 6 or more servings  - Vegetables can be eaten raw or lightly drizzled with oil and cooked  - Vegetables common to the traditional Mediterranean Diet include: artichokes, arugula, beets, broccoli, brussel sprouts, cabbage, carrots, celery, collard  greens, cucumbers, eggplant, kale, leeks, lemons, lettuce, mushrooms, okra, onions, peas, peppers, potatoes, pumpkin, radishes, rutabaga, shallots, spinach, sweet potatoes, turnips, zucchini - Fruits common to the Mediterranean Diet include: apples, apricots, avocados, cherries, clementines, dates, figs, grapefruits, grapes, melons, nectarines, oranges, peaches, pears, pomegranates, strawberries, tangerines  Fats - Replace butter and margarine with healthy oils, such as olive oil, canola oil, and tahini  - Limit nuts to no more than a handful a day  - Nuts include walnuts, almonds, pecans, pistachios, pine nuts  - Limit or avoid candied, honey roasted or heavily salted nuts - Olives are central to the [employer] - can be eaten whole or used in a variety of dishes   Meats Protein - Limiting red meat: no more than a few times a month - When eating red meat: choose lean cuts and keep the portion to the size of deck of cards - Eggs: approx. 0 to 4 times a week  - Fish and lean poultry: at least 2 a week  - Healthy protein sources include, chicken, [place], lean beef, lamb - Increase intake of seafood such as tuna, salmon, trout, mackerel, shrimp, scallops - Avoid or limit high fat processed meats such as sausage and bacon  Dairy - Include moderate amounts of low fat dairy products  - Focus on healthy dairy such as fat free yogurt, skim milk, low or reduced fat cheese - Limit dairy products higher in fat such as whole or 2% milk, cheese, ice cream  Alcohol - Moderate amounts of red wine is ok  - No more than 5 oz daily for women (all ages) and men older than age  65  - No more than 10 oz of wine daily for men younger than 5  Other - Limit sweets and other desserts  - Use herbs and spices instead of salt to flavor foods  - Herbs and spices common to the traditional Mediterranean Diet include: basil, bay leaves, chives, cloves, cumin, fennel, garlic, lavender, marjoram, mint, oregano, parsley,  pepper, rosemary, sage, savory, sumac, tarragon, thyme   It's not just a diet, it's a lifestyle:  The Mediterranean diet includes lifestyle factors typical of those in the region  Foods, drinks and meals are best eaten with others and savored Daily physical activity is important for overall good health This could be strenuous exercise like running and aerobics This could also be more leisurely activities such as walking, housework, yard-work, or taking the stairs Moderation is the key; a balanced and healthy diet accommodates most foods and drinks Consider portion sizes and frequency of consumption of certain foods   Meal Ideas & Options:  Breakfast:  Whole wheat toast or whole wheat English muffins with peanut butter & hard boiled egg Steel cut oats topped with apples & cinnamon and skim milk  Fresh fruit: banana, strawberries, melon, berries, peaches  Smoothies: strawberries, bananas, greek yogurt, peanut butter Low fat greek yogurt with blueberries and granola  Egg white omelet with spinach and mushrooms Breakfast couscous: whole wheat couscous, apricots, skim milk, cranberries  Sandwiches:  Hummus and grilled vegetables (peppers, zucchini, squash) on whole wheat bread   Grilled chicken on whole wheat pita with lettuce, tomatoes, cucumbers or tzatziki  [place] salad on whole wheat bread: tuna salad made with greek yogurt, olives, red peppers, capers, green onions Garlic rosemary lamb pita: lamb sauted with garlic, rosemary, salt & pepper; add lettuce, cucumber, greek yogurt to pita - flavor with lemon juice and black pepper  Seafood:  Mediterranean grilled salmon, seasoned with garlic, basil, parsley, lemon juice and black pepper Shrimp, lemon, and spinach whole-grain pasta salad made with low fat greek yogurt  Seared scallops with lemon orzo  Seared tuna steaks seasoned salt, pepper, coriander topped with tomato mixture of olives, tomatoes, olive oil, minced garlic, parsley, green  onions and cappers  Meats:  Herbed greek chicken salad with kalamata olives, cucumber, feta  Red bell peppers stuffed with spinach, bulgur, lean ground beef (or lentils) & topped with feta   Kebabs: skewers of chicken, tomatoes, onions, zucchini, squash  [place] burgers: made with red onions, mint, dill, lemon juice, feta cheese topped with roasted red peppers Vegetarian Cucumber salad: cucumbers, artichoke hearts, celery, red onion, feta cheese, tossed in olive oil & lemon juice  Hummus and whole grain pita points with a greek salad (lettuce, tomato, feta, olives, cucumbers, red onion) Lentil soup with celery, carrots made with vegetable broth, garlic, salt and pepper  Tabouli salad: parsley, bulgur, mint, scallions, cucumbers, tomato, radishes, lemon juice, olive oil, salt and pepper.

## 2024-06-30 ENCOUNTER — Telehealth: Payer: Self-pay | Admitting: Nurse Practitioner

## 2024-06-30 DIAGNOSIS — I10 Essential (primary) hypertension: Secondary | ICD-10-CM

## 2024-06-30 DIAGNOSIS — R609 Edema, unspecified: Secondary | ICD-10-CM

## 2024-06-30 MED ORDER — SPIRONOLACTONE 25 MG PO TABS: 25.0000 mg | ORAL_TABLET | Freq: Every day | ORAL | 3 refills | Status: AC

## 2024-06-30 MED ORDER — AMLODIPINE BESYLATE 10 MG PO TABS: 10.0000 mg | ORAL_TABLET | Freq: Every day | ORAL | 3 refills | Status: AC

## 2024-06-30 MED ORDER — FUROSEMIDE 40 MG PO TABS: 40.0000 mg | ORAL_TABLET | Freq: Every day | ORAL | 3 refills | Status: AC

## 2024-06-30 MED ORDER — ATORVASTATIN CALCIUM 80 MG PO TABS: 80.0000 mg | ORAL_TABLET | Freq: Every day | ORAL | 3 refills | Status: DC

## 2024-06-30 MED ORDER — ASPIRIN 81 MG PO TBEC: 81.0000 mg | DELAYED_RELEASE_TABLET | Freq: Every day | ORAL | 3 refills | Status: AC

## 2024-06-30 MED ORDER — LOSARTAN POTASSIUM 100 MG PO TABS: 100.0000 mg | ORAL_TABLET | Freq: Every day | ORAL | 3 refills | Status: AC

## 2024-06-30 MED ORDER — PANTOPRAZOLE SODIUM 40 MG PO TBEC: 40.0000 mg | DELAYED_RELEASE_TABLET | Freq: Every day | ORAL | 3 refills | Status: AC

## 2024-06-30 MED ORDER — CLOPIDOGREL BISULFATE 75 MG PO TABS: 75.0000 mg | ORAL_TABLET | Freq: Every day | ORAL | 3 refills | Status: AC

## 2024-06-30 NOTE — Telephone Encounter (Signed)
Pt's medications were sent to pt's new pharmacy as requested. Confirmation received.  

## 2024-06-30 NOTE — Telephone Encounter (Signed)
*  STAT* If patient is at the pharmacy, call can be transferred to refill team.   1. Which medications need to be refilled? (please list name of each medication and dose if known)  amLODipine  (NORVASC ) 10 MG tablet  aspirin  EC 81 MG tablet  atorvastatin  (LIPITOR) 80 MG tablet  clopidogrel  (PLAVIX ) 75 MG tablet  furosemide  (LASIX ) 40 MG tablet  losartan  (COZAAR ) 100 MG tablet  pantoprazole  (PROTONIX ) 40 MG tablet  spironolactone  (ALDACTONE ) 25 MG tablet    2. Would you like to learn more about the convenience, safety, & potential cost savings by using the [facility]?    3. Are you open to using the [facility] (Type [facility]. ).   4. Which pharmacy/location (including street and city if local pharmacy) is medication to be sent to? [id number] - [place], [place] - [address] AT [address]    5. Do they need a 30 day or 90 day supply? 30 day  CHANGE IN PHARMACY

## 2024-07-15 ENCOUNTER — Ambulatory Visit: Admitting: Internal Medicine

## 2024-07-15 ENCOUNTER — Encounter: Payer: Self-pay | Admitting: Internal Medicine

## 2024-07-15 VITALS — BP 138/84 | HR 86 | Ht 71.0 in | Wt 276.2 lb

## 2024-07-15 DIAGNOSIS — I5032 Chronic diastolic (congestive) heart failure: Secondary | ICD-10-CM | POA: Diagnosis not present

## 2024-07-15 DIAGNOSIS — Z2821 Immunization not carried out because of patient refusal: Secondary | ICD-10-CM

## 2024-07-15 DIAGNOSIS — E782 Mixed hyperlipidemia: Secondary | ICD-10-CM | POA: Diagnosis not present

## 2024-07-15 DIAGNOSIS — I25118 Atherosclerotic heart disease of native coronary artery with other forms of angina pectoris: Secondary | ICD-10-CM

## 2024-07-15 DIAGNOSIS — I10 Essential (primary) hypertension: Secondary | ICD-10-CM | POA: Diagnosis not present

## 2024-07-15 DIAGNOSIS — F1291 Cannabis use, unspecified, in remission: Secondary | ICD-10-CM | POA: Insufficient documentation

## 2024-07-15 DIAGNOSIS — F411 Generalized anxiety disorder: Secondary | ICD-10-CM

## 2024-07-15 DIAGNOSIS — R7303 Prediabetes: Secondary | ICD-10-CM | POA: Insufficient documentation

## 2024-07-15 MED ORDER — DULOXETINE HCL 30 MG PO CPEP: 30.0000 mg | ORAL_CAPSULE | Freq: Two times a day (BID) | ORAL | 3 refills | Status: DC

## 2024-07-15 MED ORDER — WEGOVY 0.25 MG/0.5ML ~~LOC~~ SOAJ: 0.2500 mg | SUBCUTANEOUS | 0 refills | Status: DC

## 2024-07-15 NOTE — Patient Instructions (Signed)
 Please start taking Cymbalta  twice daily as prescribed.  Please start taking Wegovy  as prescribed.  Please continue to take medications as prescribed.  Please continue to follow low carb diet and perform moderate exercise/walking at least 150 mins/week.  Please get fasting blood tests done before the next visit.

## 2024-07-15 NOTE — Assessment & Plan Note (Addendum)
 BP Readings from Last 1 Encounters:  07/15/24 138/84   Uncontrolled with amlodipine  10 mg QD and losartan  100 mg QD Cardiology recently started spironolactone , check BMP Counseled for compliance with the medications Advised DASH diet and moderate exercise/walking, at least 150 mins/week

## 2024-07-15 NOTE — Progress Notes (Signed)
 Established Patient Office Visit  Subjective:  Patient ID: [name], male    DOB: 1991-04-05  Age: 33 y.o. MRN: [id number]  CC:  Chief Complaint  Patient presents with   Hypertension    4 month f/u , states he stopped smoking marijuana and has been gasping for air while asleep and has been having nightmares,    Coronary Artery Disease    4 month f/u     HPI [name] is a 33 y.o. male with past medical history of CAD status post stent placement, HFpEF, HTN, HLD and GAD who presents for f/u of his chronic medical conditions.  CAD s/p stent placement: He had NSTEMI in 11/24, had stent placement.  He is currently on DAPT with aspirin  and Plavix .  He also takes atorvastatin  80 mg QD.  Followed by cardiology.  Denies any chest pain, dyspnea or palpitations currently.   HTN: His BP is still borderline elevated despite taking amlodipine  10 mg QD, losartan  100 mg QD and Lasix  40 mg QD.  He has chronic leg swelling due to HFpEF. He was recently placed on Spironolactone  25 mg QD by Cardiology. Denies any headache, dizziness, or chest pain currently.  HLD: He takes atorvastatin  80 mg once daily.   GAD: He reports feeling nervous and has hand and leg tremors due to anxiety.  He had responded well to Cymbalta  in the past, but has been taking it QD instead of BID.  He has quit marijuana for the last 3 weeks, but reports worsening of anxiety and nightmares now.  Denies anhedonia, SI or HI currently.  Carpal tunnel syndrome: He reports bilateral hand numbness and weakness.  He has mild wrist pain as well.  Past Medical History:  Diagnosis Date   HTN (hypertension)    Hyperlipidemia    MI (myocardial infarction) (HCC) 08/05/2023    Past Surgical History:  Procedure Laterality Date   CORONARY STENT INTERVENTION N/A 08/06/2023   Procedure: CORONARY STENT INTERVENTION;  Surgeon: Ladona Heinz, MD;  Location: [facility];  Service: Cardiovascular;  Laterality: N/A;   LEFT HEART CATH  AND CORONARY ANGIOGRAPHY N/A 08/06/2023   Procedure: LEFT HEART CATH AND CORONARY ANGIOGRAPHY;  Surgeon: Ladona Heinz, MD;  Location: [facility];  Service: Cardiovascular;  Laterality: N/A;    Family History  Problem Relation Age of Onset   Heart attack Paternal Grandfather 28       CABG    Social History   Socioeconomic History   Marital status: Married    Spouse name: Not on file   Number of children: Not on file   Years of education: Not on file   Highest education level: Not on file  Occupational History   Not on file  Tobacco Use   Smoking status: Former    Current packs/day: 0.50    Average packs/day: 0.5 packs/day for 21.3 years (10.7 ttl pk-yrs)    Types: Cigarettes    Start date: 03/21/2003   Smokeless tobacco: Never  Vaping Use   Vaping status: Every Day   Substances: Nicotine  Substance and Sexual Activity   Alcohol use: Not Currently    Comment: occ   Drug use: Not Currently    Types: Marijuana   Sexual activity: Not on file  Other Topics Concern   Not on file  Social History Narrative   Not on file   Social Drivers of Health   Financial Resource Strain: Not on file  Food Insecurity: Not  on file  Transportation Needs: Not on file  Physical Activity: Not on file  Stress: Not on file  Social Connections: Not on file  Intimate Partner Violence: Not on file    Outpatient Medications Prior to Visit  Medication Sig Dispense Refill   amLODipine  (NORVASC ) 10 MG tablet Take 1 tablet (10 mg total) by mouth daily. 90 tablet 3   aspirin  EC 81 MG tablet Take 1 tablet (81 mg total) by mouth daily. Swallow whole. 90 tablet 3   atorvastatin  (LIPITOR) 80 MG tablet Take 1 tablet (80 mg total) by mouth daily. 90 tablet 3   Blood Pressure Monitor DEVI 1 each by Does not apply route daily. Adult size large electric omron cuff 1 each 0   clopidogrel  (PLAVIX ) 75 MG tablet Take 1 tablet (75 mg total) by mouth daily. 90 tablet 3   fluticasone  (FLONASE ) 50 MCG/ACT  nasal spray Place 2 sprays into both nostrils daily. 16 g 1   furosemide  (LASIX ) 40 MG tablet Take 1 tablet (40 mg total) by mouth daily. 90 tablet 3   losartan  (COZAAR ) 100 MG tablet Take 1 tablet (100 mg total) by mouth daily. 90 tablet 3   nitroGLYCERIN  (NITROSTAT ) 0.4 MG SL tablet Place 0.4 mg under the tongue every 5 (five) minutes x 3 doses as needed for chest pain (if no relief after 2nd dose, proceed to ED or call 911).     pantoprazole  (PROTONIX ) 40 MG tablet Take 1 tablet (40 mg total) by mouth daily. 90 tablet 3   spironolactone  (ALDACTONE ) 25 MG tablet Take 1 tablet (25 mg total) by mouth daily. 90 tablet 3   amoxicillin -clavulanate (AUGMENTIN ) 875-125 MG tablet Take 1 tablet by mouth 2 (two) times daily. 14 tablet 0   DULoxetine  (CYMBALTA ) 30 MG capsule Take 1 capsule (30 mg total) by mouth 2 (two) times daily. 60 capsule 3   No facility-administered medications prior to visit.    No Known Allergies  ROS Review of Systems  Constitutional:  Negative for chills and fever.  HENT:  Negative for congestion and sore throat.   Eyes:  Negative for pain and discharge.  Respiratory:  Negative for cough and shortness of breath.   Cardiovascular:  Negative for chest pain and palpitations.  Gastrointestinal:  Negative for constipation, diarrhea, nausea and vomiting.  Endocrine: Negative for polydipsia and polyuria.  Genitourinary:  Negative for dysuria and hematuria.  Musculoskeletal:  Negative for neck pain and neck stiffness.  Skin:  Negative for rash.  Neurological:  Positive for weakness (B/l hands) and numbness (B/l hands). Negative for dizziness and headaches.  Psychiatric/Behavioral:  Negative for agitation and behavioral problems.       Objective:    Physical Exam Vitals reviewed.  Constitutional:      General: He is not in acute distress.    Appearance: He is not diaphoretic.  HENT:     Head: Normocephalic and atraumatic.     Nose: No congestion.     Mouth/Throat:      Mouth: Mucous membranes are moist.  Eyes:     General: No scleral icterus.    Extraocular Movements: Extraocular movements intact.     Pupils: Pupils are equal, round, and reactive to light.  Cardiovascular:     Rate and Rhythm: Normal rate and regular rhythm.     Heart sounds: Normal heart sounds. No murmur heard. Pulmonary:     Breath sounds: Normal breath sounds. No wheezing or rales.  Musculoskeletal:     Cervical  back: Neck supple. No tenderness.     Right lower leg: Edema (1+) present.     Left lower leg: Edema (1+) present.     Comments: Phalen sign positive  Skin:    General: Skin is warm.     Findings: No rash.  Neurological:     General: No focal deficit present.     Mental Status: He is alert and oriented to person, place, and time.  Psychiatric:        Mood and Affect: Mood normal.        Behavior: Behavior normal.     BP 138/84 (BP Location: Left Arm)   Pulse 86   Ht 5' 11 (1.803 m)   Wt 276 lb 3.2 oz (125.3 kg)   SpO2 98%   BMI 38.52 kg/m  Wt Readings from Last 3 Encounters:  07/15/24 276 lb 3.2 oz (125.3 kg)  06/17/24 266 lb 12.8 oz (121 kg)  05/08/24 264 lb (119.7 kg)    Lab Results  Component Value Date   TSH 3.260 03/13/2024   Lab Results  Component Value Date   WBC 6.9 01/17/2024   HGB 14.4 01/17/2024   HCT 43.1 01/17/2024   MCV 93.3 01/17/2024   PLT 281 01/17/2024   Lab Results  Component Value Date   NA 142 03/13/2024   K 4.6 03/13/2024   CO2 24 03/13/2024   GLUCOSE 91 03/13/2024   BUN 11 03/13/2024   CREATININE 0.99 03/13/2024   BILITOT 0.3 03/13/2024   ALKPHOS 100 03/13/2024   AST 26 03/13/2024   ALT 47 (H) 03/13/2024   PROT 7.0 03/13/2024   ALBUMIN 4.6 03/13/2024   CALCIUM  9.8 03/13/2024   ANIONGAP 12 01/17/2024   EGFR 103 03/13/2024   Lab Results  Component Value Date   CHOL 143 03/13/2024   Lab Results  Component Value Date   HDL 36 (L) 03/13/2024   Lab Results  Component Value Date   LDLCALC 82 03/13/2024    Lab Results  Component Value Date   TRIG 138 03/13/2024   Lab Results  Component Value Date   CHOLHDL 4.0 03/13/2024   Lab Results  Component Value Date   HGBA1C 5.9 (H) 03/13/2024      Assessment & Plan:   Problem List Items Addressed This Visit       Cardiovascular and Mediastinum   Essential hypertension - Primary   BP Readings from Last 1 Encounters:  07/15/24 138/84   Uncontrolled with amlodipine  10 mg QD and losartan  100 mg QD Cardiology recently started spironolactone , check BMP Counseled for compliance with the medications Advised DASH diet and moderate exercise/walking, at least 150 mins/week      CAD s/p stent placement   On DAPT and statin On losartan  Denies chest pain currently Followed by cardiology  Started Wegovy  for cardiovascular risk reduction -plan to increase dose as tolerated      Relevant Medications   semaglutide -weight management (WEGOVY ) 0.25 MG/0.5ML SOAJ SQ injection   Other Relevant Orders   Lipid Profile   Chronic heart failure with preserved ejection fraction (HCC)   On losartan  100 mg QD On Lasix  40 mg QD Reports recently gaining 50 lbs in 7 months, unclear if related to fluid versus caloric intake Advised to avoid soft drinks, limit sodium intake to maximum 2 g in a day and perform leg elevation for leg swelling Recently started spironolactone , followed by cardiology      Relevant Orders   CMP14+EGFR   CBC with  Differential/Platelet     Other   Hyperlipidemia   On Lipitor 80 mg QD Checked lipid profile      Relevant Orders   Lipid Profile   GAD (generalized anxiety disorder)   Uncontrolled He has been taking Cymbalta  30 mg QD, increased dose to Cymbalta  30 mg BID If persistent anxiety, can adjust dose of Cymbalta  and/or add Buspar      Relevant Medications   DULoxetine  (CYMBALTA ) 30 MG capsule   Marijuana use disorder in remission   He quit marijuana use about 3 weeks ago Has worsening of anxiety and  nightmares, advised to avoid restarting marijuana use Increased dose of Cymbalta  to address GAD      Prediabetes   Lab Results  Component Value Date   HGBA1C 5.9 (H) 03/13/2024   Advised to follow DASH diet Wegovy  can be beneficial for prediabetes as well      Relevant Orders   CMP14+EGFR   Hemoglobin A1c   Other Visit Diagnoses       Refused influenza vaccine           Meds ordered this encounter  Medications   semaglutide -weight management (WEGOVY ) 0.25 MG/0.5ML SOAJ SQ injection    Sig: Inject 0.25 mg into the skin every 7 (seven) days.    Dispense:  2 mL    Refill:  0   DULoxetine  (CYMBALTA ) 30 MG capsule    Sig: Take 1 capsule (30 mg total) by mouth 2 (two) times daily.    Dispense:  60 capsule    Refill:  3    Follow-up: Return in about 3 months (around 10/14/2024) for CAD and weight management.    Suzzane MARLA Blanch, MD

## 2024-07-15 NOTE — Assessment & Plan Note (Signed)
 Uncontrolled He has been taking Cymbalta  30 mg QD, increased dose to Cymbalta  30 mg BID If persistent anxiety, can adjust dose of Cymbalta  and/or add Buspar

## 2024-07-15 NOTE — Assessment & Plan Note (Signed)
 On losartan  100 mg QD On Lasix  40 mg QD Reports recently gaining 50 lbs in 7 months, unclear if related to fluid versus caloric intake Advised to avoid soft drinks, limit sodium intake to maximum 2 g in a day and perform leg elevation for leg swelling Recently started spironolactone , followed by cardiology

## 2024-07-15 NOTE — Assessment & Plan Note (Signed)
 He quit marijuana use about 3 weeks ago Has worsening of anxiety and nightmares, advised to avoid restarting marijuana use Increased dose of Cymbalta  to address GAD

## 2024-07-15 NOTE — Assessment & Plan Note (Signed)
 Lab Results  Component Value Date   HGBA1C 5.9 (H) 03/13/2024   Advised to follow DASH diet Wegovy  can be beneficial for prediabetes as well

## 2024-07-15 NOTE — Assessment & Plan Note (Signed)
 On Lipitor 80 mg QD Checked lipid profile

## 2024-07-15 NOTE — Assessment & Plan Note (Addendum)
 On DAPT and statin On losartan  Denies chest pain currently Followed by cardiology  Started Wegovy  for cardiovascular risk reduction -plan to increase dose as tolerated

## 2024-07-17 ENCOUNTER — Ambulatory Visit: Payer: Self-pay | Admitting: Nurse Practitioner

## 2024-07-17 DIAGNOSIS — Z79899 Other long term (current) drug therapy: Secondary | ICD-10-CM

## 2024-07-17 DIAGNOSIS — E785 Hyperlipidemia, unspecified: Secondary | ICD-10-CM

## 2024-07-17 LAB — COMPREHENSIVE METABOLIC PANEL WITH GFR
ALT: 74 IU/L — ABNORMAL HIGH (ref 0–44)
AST: 42 IU/L — ABNORMAL HIGH (ref 0–40)
Albumin: 4.9 g/dL (ref 4.1–5.1)
Alkaline Phosphatase: 119 IU/L (ref 47–123)
BUN/Creatinine Ratio: 14 (ref 9–20)
BUN: 14 mg/dL (ref 6–20)
Bilirubin Total: 0.2 mg/dL (ref 0.0–1.2)
CO2: 24 mmol/L (ref 20–29)
Calcium: 10.1 mg/dL (ref 8.7–10.2)
Chloride: 98 mmol/L (ref 96–106)
Creatinine, Ser: 1 mg/dL (ref 0.76–1.27)
Globulin, Total: 2.9 g/dL (ref 1.5–4.5)
Glucose: 106 mg/dL — ABNORMAL HIGH (ref 70–99)
Potassium: 5.1 mmol/L (ref 3.5–5.2)
Sodium: 139 mmol/L (ref 134–144)
Total Protein: 7.8 g/dL (ref 6.0–8.5)
eGFR: 101 mL/min/1.73 (ref >59)

## 2024-07-17 LAB — BRAIN NATRIURETIC PEPTIDE: BNP: <2.5 pg/mL (ref 0.0–100.0)

## 2024-07-17 LAB — MAGNESIUM: Magnesium: 2.2 mg/dL (ref 1.6–2.3)

## 2024-07-18 ENCOUNTER — Telehealth: Payer: Self-pay

## 2024-07-18 NOTE — Telephone Encounter (Signed)
 Copied from CRM [id number]. Topic: Clinical - Prescription Issue >> Jul 18, 2024  2:34 PM [name] wrote: Pt says wegovy  is 1500 a month and wants to know if he can be switched to [place]

## 2024-07-21 ENCOUNTER — Other Ambulatory Visit (HOSPITAL_COMMUNITY): Payer: Self-pay

## 2024-07-21 ENCOUNTER — Telehealth: Payer: Self-pay | Admitting: Pharmacy Technician

## 2024-07-21 NOTE — Telephone Encounter (Signed)
 PA request has been Received. New Encounter has been or will be created for follow up. For additional info see Pharmacy Prior Auth telephone encounter from 07/21/2024.

## 2024-07-21 NOTE — Telephone Encounter (Signed)
 Would like an alternative?

## 2024-07-21 NOTE — Telephone Encounter (Signed)
 Pharmacy Patient Advocate Encounter   Received notification from Pt Calls Messages that prior authorization for Wegovy  0.25mg /0.48ml auto-injectors is required/requested.   Insurance verification completed.   The patient is insured through [employer] .   Per test claim: The current 28 day co-pay is, $1,537.88.  No PA needed at this time. This test claim was processed through [facility]- copay amounts may vary at other pharmacies due to pharmacy/plan contracts, or as the patient moves through the different stages of their insurance plan.    Not covered by plan/member pays 100% cash rate.

## 2024-07-28 ENCOUNTER — Telehealth: Payer: Self-pay | Admitting: Nurse Practitioner

## 2024-07-28 NOTE — Telephone Encounter (Signed)
 Spoke with patient - c/o weight gain.  No chest pain.  Minimal SOB, states he was able to throw the football around with his kids the weekend without difficulty.  Does notice feet / ankle swelling & possibly some in his abdomen.  Is currently taking his Lasix  40mg  daily & Spironolactone  25mg  daily.  States he is weighing on scales at his work.  06/17/24 - at OV with Miriam - 266lb 07/15/24 - at OV with pcp - 276lb - per notes in epic, was prescribed Ozempic but was unable to get due to cost 10/6 - 284lb

## 2024-07-28 NOTE — Telephone Encounter (Signed)
  Pt c/o swelling/edema: STAT if pt has developed SOB within 24 hours  If swelling, where is the swelling located? Both legs   How much weight have you gained and in what time span? 16 lbs since Friday   Have you gained 2 pounds in a day or 5 pounds in a week?   Do you have a log of your daily weights (if so, list)? Last Friday 270 lbs today 286 lbs  Are you currently taking a fluid pill? Yes   Are you currently SOB? Some but not too bad   Have you traveled recently in a car or plane for an extended period of time? No

## 2024-07-29 NOTE — Telephone Encounter (Signed)
 Verify taking lasix  40mg  daily, if so increase to 40mg  in am and 20mg  in pm x 4 days then back to prior dose, update us  Friday.   JINNY Ross MD

## 2024-07-29 NOTE — Telephone Encounter (Signed)
 No answer

## 2024-07-31 NOTE — Telephone Encounter (Signed)
 Replied via mychart.

## 2024-08-01 NOTE — Telephone Encounter (Signed)
 Patient notified and verbalized understanding.

## 2024-08-31 ENCOUNTER — Telehealth: Admitting: Physician Assistant

## 2024-08-31 DIAGNOSIS — R42 Dizziness and giddiness: Secondary | ICD-10-CM

## 2024-08-31 NOTE — Patient Instructions (Signed)
 [name], thank you for joining Teena Shuck, PA-C for today's virtual visit.  While this provider is not your primary care provider (PCP), if your PCP is located in our provider database this encounter information will be shared with them immediately following your visit.   A [facility] MyChart account gives you access to today's visit and all your visits, tests, and labs performed at [facility]  click here if you don't have a [facility] MyChart account or go to [email or web address]  Consent: (Patient) [name] provided verbal consent for this virtual visit at the beginning of the encounter.  Current Medications:  Current Outpatient Medications:    amLODipine  (NORVASC ) 10 MG tablet, Take 1 tablet (10 mg total) by mouth daily., Disp: 90 tablet, Rfl: 3   aspirin  EC 81 MG tablet, Take 1 tablet (81 mg total) by mouth daily. Swallow whole., Disp: 90 tablet, Rfl: 3   atorvastatin  (LIPITOR) 80 MG tablet, Take 1 tablet (80 mg total) by mouth daily. (Patient not taking: Reported on 07/18/2024), Disp: 90 tablet, Rfl: 3   Blood Pressure Monitor DEVI, 1 each by Does not apply route daily. Adult size large electric omron cuff, Disp: 1 each, Rfl: 0   clopidogrel  (PLAVIX ) 75 MG tablet, Take 1 tablet (75 mg total) by mouth daily., Disp: 90 tablet, Rfl: 3   DULoxetine  (CYMBALTA ) 30 MG capsule, Take 1 capsule (30 mg total) by mouth 2 (two) times daily., Disp: 60 capsule, Rfl: 3   fluticasone  (FLONASE ) 50 MCG/ACT nasal spray, Place 2 sprays into both nostrils daily., Disp: 16 g, Rfl: 1   furosemide  (LASIX ) 40 MG tablet, Take 1 tablet (40 mg total) by mouth daily., Disp: 90 tablet, Rfl: 3   losartan  (COZAAR ) 100 MG tablet, Take 1 tablet (100 mg total) by mouth daily., Disp: 90 tablet, Rfl: 3   nitroGLYCERIN  (NITROSTAT ) 0.4 MG SL tablet, Place 0.4 mg under the tongue every 5 (five) minutes x 3 doses as needed for chest pain (if no relief after 2nd dose, proceed to ED or call 911).,  Disp: , Rfl:    pantoprazole  (PROTONIX ) 40 MG tablet, Take 1 tablet (40 mg total) by mouth daily., Disp: 90 tablet, Rfl: 3   semaglutide -weight management (WEGOVY ) 0.25 MG/0.5ML SOAJ SQ injection, Inject 0.25 mg into the skin every 7 (seven) days., Disp: 2 mL, Rfl: 0   spironolactone  (ALDACTONE ) 25 MG tablet, Take 1 tablet (25 mg total) by mouth daily., Disp: 90 tablet, Rfl: 3   Medications ordered in this encounter:  No orders of the defined types were placed in this encounter.    *If you need refills on other medications prior to your next appointment, please contact your pharmacy*  Follow-Up: Call back or seek an in-person evaluation if the symptoms worsen or if the condition fails to improve as anticipated.  [facility] [phone]  Other Instructions Follow up with primary provider in 24-48 hours. Report to nearest ER with any worsening symptoms.    If you have been instructed to have an in-person evaluation today at a local Urgent Care facility, please use the link below. It will take you to a list of all of our available [facility], including address, phone number and hours of operation. Please do not delay care.  [facility]  If you or a family member do not have a primary care provider, use the link below to schedule a visit and establish care. When you choose a  [facility] primary care physician or advanced practice provider, you gain a long-term partner in health. Find a Primary Care Provider  Learn more about [facility]'s in-office and virtual care options: [facility] - Get Care Now

## 2024-08-31 NOTE — Progress Notes (Signed)
 Virtual Visit Consent   [name], you are scheduled for a virtual visit with a [facility] provider today. Just as with appointments in the office, your consent must be obtained to participate. Your consent will be active for this visit and any virtual visit you may have with one of our providers in the next 365 days. If you have a MyChart account, a copy of this consent can be sent to you electronically.  As this is a virtual visit, video technology does not allow for your provider to perform a traditional examination. This may limit your provider's ability to fully assess your condition. If your provider identifies any concerns that need to be evaluated in person or the need to arrange testing (such as labs, EKG, etc.), we will make arrangements to do so. Although advances in technology are sophisticated, we cannot ensure that it will always work on either your end or our end. If the connection with a video visit is poor, the visit may have to be switched to a telephone visit. With either a video or telephone visit, we are not always able to ensure that we have a secure connection.  By engaging in this virtual visit, you consent to the provision of healthcare and authorize for your insurance to be billed (if applicable) for the services provided during this visit. Depending on your insurance coverage, you may receive a charge related to this service.  I need to obtain your verbal consent now. Are you willing to proceed with your visit today? [name] has provided verbal consent on 08/31/2024 for a virtual visit (video or telephone). Teena Shuck, [place]  Date: 08/31/2024 5:19 PM   Virtual Visit via Video Note   I, Teena Shuck, connected with  [name]  ([id number], 11-27-1990) on 08/31/24 at  5:15 PM EST by a video-enabled telemedicine application and verified that I am speaking with the correct person using two identifiers.  Location: Patient: Virtual Visit Location Patient:  Home Provider: Virtual Visit Location Provider: Home Office   I discussed the limitations of evaluation and management by telemedicine and the availability of in person appointments. The patient expressed understanding and agreed to proceed.    History of Present Illness: [name] is a 33 y.o. who identifies as a male who was assigned male at birth, and is being seen today for lightheadedness for 3 days.  HPI: Dizziness This is a new problem. The current episode started in the past 7 days. The problem occurs intermittently. The problem has been unchanged. Associated symptoms include headaches. Nothing aggravates the symptoms. He has tried nothing for the symptoms. The treatment provided no relief.    Problems:  Patient Active Problem List   Diagnosis Date Noted   Marijuana use disorder in remission 07/15/2024   Prediabetes 07/15/2024   CAD s/p stent placement 03/13/2024   Acute non-recurrent maxillary sinusitis 03/13/2024   GAD (generalized anxiety disorder) 03/13/2024   Chronic heart failure with preserved ejection fraction (HCC) 03/13/2024   Morbid obesity (HCC) 03/13/2024   Bilateral carpal tunnel syndrome 03/13/2024   Hyperlipidemia 08/06/2023   Essential hypertension 08/05/2023    Allergies: No Known Allergies Medications:  Current Outpatient Medications:    amLODipine  (NORVASC ) 10 MG tablet, Take 1 tablet (10 mg total) by mouth daily., Disp: 90 tablet, Rfl: 3   aspirin  EC 81 MG tablet, Take 1 tablet (81 mg total) by mouth daily. Swallow whole., Disp: 90 tablet, Rfl: 3   atorvastatin  (LIPITOR) 80 MG  tablet, Take 1 tablet (80 mg total) by mouth daily. (Patient not taking: Reported on 07/18/2024), Disp: 90 tablet, Rfl: 3   Blood Pressure Monitor DEVI, 1 each by Does not apply route daily. Adult size large electric omron cuff, Disp: 1 each, Rfl: 0   clopidogrel  (PLAVIX ) 75 MG tablet, Take 1 tablet (75 mg total) by mouth daily., Disp: 90 tablet, Rfl: 3   DULoxetine  (CYMBALTA ) 30  MG capsule, Take 1 capsule (30 mg total) by mouth 2 (two) times daily., Disp: 60 capsule, Rfl: 3   fluticasone  (FLONASE ) 50 MCG/ACT nasal spray, Place 2 sprays into both nostrils daily., Disp: 16 g, Rfl: 1   furosemide  (LASIX ) 40 MG tablet, Take 1 tablet (40 mg total) by mouth daily., Disp: 90 tablet, Rfl: 3   losartan  (COZAAR ) 100 MG tablet, Take 1 tablet (100 mg total) by mouth daily., Disp: 90 tablet, Rfl: 3   nitroGLYCERIN  (NITROSTAT ) 0.4 MG SL tablet, Place 0.4 mg under the tongue every 5 (five) minutes x 3 doses as needed for chest pain (if no relief after 2nd dose, proceed to ED or call 911)., Disp: , Rfl:    pantoprazole  (PROTONIX ) 40 MG tablet, Take 1 tablet (40 mg total) by mouth daily., Disp: 90 tablet, Rfl: 3   semaglutide -weight management (WEGOVY ) 0.25 MG/0.5ML SOAJ SQ injection, Inject 0.25 mg into the skin every 7 (seven) days., Disp: 2 mL, Rfl: 0   spironolactone  (ALDACTONE ) 25 MG tablet, Take 1 tablet (25 mg total) by mouth daily., Disp: 90 tablet, Rfl: 3  Observations/Objective: Patient is well-developed, well-nourished in no acute distress.  Resting comfortably  at home.  Head is normocephalic, atraumatic.  No labored breathing.  Speech is clear and coherent with logical content.  Patient is alert and oriented at baseline.   Assessment and Plan: 1. Lightheadedness (Primary)  Patient with ongoing dizziness and lightheadedness. Advised to be seen in person for BP check, labs and imaging should provider feel its warranted. Patient agreed to the plan.   Follow Up Instructions: I discussed the assessment and treatment plan with the patient. The patient was provided an opportunity to ask questions and all were answered. The patient agreed with the plan and demonstrated an understanding of the instructions.  A copy of instructions were sent to the patient via MyChart unless otherwise noted below.     The patient was advised to call back or seek an in-person evaluation if the  symptoms worsen or if the condition fails to improve as anticipated.    Teena Shuck, PA-C

## 2024-09-03 ENCOUNTER — Encounter: Payer: Self-pay | Admitting: Internal Medicine

## 2024-09-03 ENCOUNTER — Ambulatory Visit: Admitting: Internal Medicine

## 2024-09-03 VITALS — BP 139/82 | HR 95 | Ht 71.0 in | Wt 271.8 lb

## 2024-09-03 DIAGNOSIS — R42 Dizziness and giddiness: Secondary | ICD-10-CM | POA: Diagnosis not present

## 2024-09-03 DIAGNOSIS — E782 Mixed hyperlipidemia: Secondary | ICD-10-CM | POA: Diagnosis not present

## 2024-09-03 DIAGNOSIS — F411 Generalized anxiety disorder: Secondary | ICD-10-CM

## 2024-09-03 DIAGNOSIS — R7401 Elevation of levels of liver transaminase levels: Secondary | ICD-10-CM | POA: Insufficient documentation

## 2024-09-03 DIAGNOSIS — K219 Gastro-esophageal reflux disease without esophagitis: Secondary | ICD-10-CM | POA: Insufficient documentation

## 2024-09-03 DIAGNOSIS — R200 Anesthesia of skin: Secondary | ICD-10-CM | POA: Insufficient documentation

## 2024-09-03 MED ORDER — DULOXETINE HCL 30 MG PO CPEP: 30.0000 mg | ORAL_CAPSULE | Freq: Two times a day (BID) | ORAL | 3 refills | Status: AC

## 2024-09-03 MED ORDER — MECLIZINE HCL 25 MG PO TABS: 25.0000 mg | ORAL_TABLET | Freq: Three times a day (TID) | ORAL | 0 refills | Status: AC | PRN

## 2024-09-03 NOTE — Progress Notes (Signed)
 Acute Office Visit  Subjective:    Patient ID: [name], male    DOB: [birth year], 33 y.o.   MRN: [id number]  Chief Complaint  Patient presents with   Dizziness    Feeling lightheaded and dizzy ongoing for 4 days.     HPI Patient is in today for complaint of dizzy spells, lasting for a second or 2 for the last 4 days.  He has noticed dizzy spells especially in the standing position, and when he moves head suddenly.  Denies any recent URTI symptoms.  He admits that he needs to increase fluid intake.  He also reports mild occipital area headache, lasting for few seconds during the dizzy spells.  He also reports numbness in the upper left abdominal wall area, and feels pulling underneath it.  He is currently taking pantoprazole  for GERD.  Of note, he has recently run out of his Cymbalta .  Past Medical History:  Diagnosis Date   HTN (hypertension)    Hyperlipidemia    MI (myocardial infarction) (HCC) 08/05/2023    Past Surgical History:  Procedure Laterality Date   CORONARY STENT INTERVENTION N/A 08/06/2023   Procedure: CORONARY STENT INTERVENTION;  Surgeon: Ladona Heinz, MD;  Location: [facility] INVASIVE CV [facility];  Service: Cardiovascular;  Laterality: N/A;   LEFT HEART CATH AND CORONARY ANGIOGRAPHY N/A 08/06/2023   Procedure: LEFT HEART CATH AND CORONARY ANGIOGRAPHY;  Surgeon: Ladona Heinz, MD;  Location: [facility] INVASIVE CV [facility];  Service: Cardiovascular;  Laterality: N/A;    Family History  Problem Relation Age of Onset   Heart attack Paternal Grandfather 74       CABG    Social History   Socioeconomic History   Marital status: Married    Spouse name: Not on file   Number of children: Not on file   Years of education: Not on file   Highest education level: Not on file  Occupational History   Not on file  Tobacco Use   Smoking status: Former    Current packs/day: 0.50    Average packs/day: 0.5 packs/day for 21.5 years (10.7 ttl pk-yrs)    Types: Cigarettes    Start date:  03/21/2003   Smokeless tobacco: Never  Vaping Use   Vaping status: Every Day   Substances: Nicotine  Substance and Sexual Activity   Alcohol use: Not Currently    Comment: occ   Drug use: Not Currently    Types: Marijuana   Sexual activity: Not on file  Other Topics Concern   Not on file  Social History Narrative   Not on file   Social Drivers of Health   Financial Resource Strain: Not on file  Food Insecurity: Not on file  Transportation Needs: Not on file  Physical Activity: Not on file  Stress: Not on file  Social Connections: Not on file  Intimate Partner Violence: Not on file    Outpatient Medications Prior to Visit  Medication Sig Dispense Refill   amLODipine  (NORVASC ) 10 MG tablet Take 1 tablet (10 mg total) by mouth daily. 90 tablet 3   aspirin  EC 81 MG tablet Take 1 tablet (81 mg total) by mouth daily. Swallow whole. 90 tablet 3   Blood Pressure Monitor DEVI 1 each by Does not apply route daily. Adult size large electric omron cuff 1 each 0   clopidogrel  (PLAVIX ) 75 MG tablet Take 1 tablet (75 mg total) by mouth daily. 90 tablet 3   fluticasone  (FLONASE ) 50 MCG/ACT nasal spray Place 2 sprays  into both nostrils daily. 16 g 1   furosemide  (LASIX ) 40 MG tablet Take 1 tablet (40 mg total) by mouth daily. 90 tablet 3   losartan  (COZAAR ) 100 MG tablet Take 1 tablet (100 mg total) by mouth daily. 90 tablet 3   nitroGLYCERIN  (NITROSTAT ) 0.4 MG SL tablet Place 0.4 mg under the tongue every 5 (five) minutes x 3 doses as needed for chest pain (if no relief after 2nd dose, proceed to ED or call 911).     pantoprazole  (PROTONIX ) 40 MG tablet Take 1 tablet (40 mg total) by mouth daily. 90 tablet 3   spironolactone  (ALDACTONE ) 25 MG tablet Take 1 tablet (25 mg total) by mouth daily. 90 tablet 3   DULoxetine  (CYMBALTA ) 30 MG capsule Take 1 capsule (30 mg total) by mouth 2 (two) times daily. 60 capsule 3   semaglutide -weight management (WEGOVY ) 0.25 MG/0.5ML SOAJ SQ injection Inject  0.25 mg into the skin every 7 (seven) days. 2 mL 0   atorvastatin  (LIPITOR) 80 MG tablet Take 1 tablet (80 mg total) by mouth daily. (Patient not taking: Reported on 09/03/2024) 90 tablet 3   No facility-administered medications prior to visit.    No Known Allergies  Review of Systems  Constitutional:  Negative for chills and fever.  HENT:  Negative for congestion and sore throat.   Eyes:  Negative for pain and discharge.  Respiratory:  Negative for cough and shortness of breath.   Cardiovascular:  Negative for chest pain and palpitations.  Gastrointestinal:  Negative for constipation, diarrhea, nausea and vomiting.  Endocrine: Negative for polydipsia and polyuria.  Genitourinary:  Negative for dysuria and hematuria.  Musculoskeletal:  Negative for neck pain and neck stiffness.  Skin:  Negative for rash.  Neurological:  Positive for weakness (B/l hands) and numbness (B/l hands and abdominal wall). Negative for dizziness and headaches.  Psychiatric/Behavioral:  Negative for agitation and behavioral problems.        Objective:    Physical Exam Vitals reviewed.  Constitutional:      General: He is not in acute distress.    Appearance: He is not diaphoretic.  HENT:     Head: Normocephalic and atraumatic.     Nose: No congestion.     Mouth/Throat:     Mouth: Mucous membranes are moist.  Eyes:     General: No scleral icterus.    Extraocular Movements: Extraocular movements intact.     Pupils: Pupils are equal, round, and reactive to light.  Cardiovascular:     Rate and Rhythm: Normal rate and regular rhythm.     Heart sounds: Normal heart sounds. No murmur heard. Pulmonary:     Breath sounds: Normal breath sounds. No wheezing or rales.  Musculoskeletal:     Cervical back: Neck supple. No tenderness.     Right lower leg: Edema (1+) present.     Left lower leg: Edema (1+) present.     Comments: Phalen sign positive  Skin:    General: Skin is warm.     Findings: No rash.   Neurological:     General: No focal deficit present.     Mental Status: He is alert and oriented to person, place, and time.  Psychiatric:        Mood and Affect: Mood normal.        Behavior: Behavior normal.     BP 139/82   Pulse 95   Ht 5' 11 (1.803 m)   Wt 271 lb 12.8 oz (123.3  kg)   SpO2 97%   BMI 37.91 kg/m  Wt Readings from Last 3 Encounters:  09/03/24 271 lb 12.8 oz (123.3 kg)  07/15/24 276 lb 3.2 oz (125.3 kg)  06/17/24 266 lb 12.8 oz (121 kg)        Assessment & Plan:   Problem List Items Addressed This Visit       Digestive   Gastroesophageal reflux disease   Usually well-controlled with pantoprazole  40 mg QD, continue for now Avoid hot and spicy food      Relevant Medications   meclizine (ANTIVERT) 25 MG tablet     Other   Hyperlipidemia   Was on Lipitor 80 mg QD, was recently held by cardiology due to elevated liver enzymes Check lipid profile and CMP      Relevant Orders   Lipid Profile   GAD (generalized anxiety disorder)   Uncontrolled as he has run out of his Cymbalta  Had recently increased dose to Cymbalta  30 mg BID, refilled If persistent anxiety, can adjust dose of Cymbalta  and/or add Buspar      Relevant Medications   DULoxetine  (CYMBALTA ) 30 MG capsule   Dizziness - Primary   Orthostatic vitals negative today Since he takes losartan , spironolactone  and Lasix , advised to maintain adequate hydration Avoid sudden positional changes Some of his symptoms are concerning for vertigo as well-meclizine as needed for dizziness Simple vestibular exercises recommended, video material shown      Relevant Medications   meclizine (ANTIVERT) 25 MG tablet   Other Relevant Orders   CMP14+EGFR   Numbness   Reports numbness in the epigastric area with underlying pulling sensation Unclear etiology - neuropathy vs abdominal muscle strain Avoid heavy lifting more than 20 lbs for the next 3 weeks, work note provided Advised to restart Cymbalta  30  mg BID for now      Elevated transaminase level   Last CMP reviewed, cardiology stopped her atorvastatin  Recheck CMP today Avoid alcohol intake Maintain adequate water intake        Meds ordered this encounter  Medications   DULoxetine  (CYMBALTA ) 30 MG capsule    Sig: Take 1 capsule (30 mg total) by mouth 2 (two) times daily.    Dispense:  60 capsule    Refill:  3   meclizine (ANTIVERT) 25 MG tablet    Sig: Take 1 tablet (25 mg total) by mouth 3 (three) times daily as needed for dizziness.    Dispense:  30 tablet    Refill:  0     Wilhelm Ganaway MARLA Blanch, MD

## 2024-09-03 NOTE — Assessment & Plan Note (Signed)
 Reports numbness in the epigastric area with underlying pulling sensation Unclear etiology - neuropathy vs abdominal muscle strain Avoid heavy lifting more than 20 lbs for the next 3 weeks, work note provided Advised to restart Cymbalta  30 mg BID for now

## 2024-09-03 NOTE — Assessment & Plan Note (Signed)
 Uncontrolled as he has run out of his Cymbalta  Had recently increased dose to Cymbalta  30 mg BID, refilled If persistent anxiety, can adjust dose of Cymbalta  and/or add Buspar

## 2024-09-03 NOTE — Patient Instructions (Signed)
 Please start taking Meclizine as needed for dizziness.  Please perform simple vestibular exercises as discussed.  Please maintain at least 64 ounces of fluid intake in a day.

## 2024-09-03 NOTE — Assessment & Plan Note (Signed)
 Usually well-controlled with pantoprazole  40 mg QD, continue for now Avoid hot and spicy food

## 2024-09-03 NOTE — Assessment & Plan Note (Signed)
 Last CMP reviewed, cardiology stopped her atorvastatin  Recheck CMP today Avoid alcohol intake Maintain adequate water intake

## 2024-09-03 NOTE — Assessment & Plan Note (Signed)
 Was on Lipitor 80 mg QD, was recently held by cardiology due to elevated liver enzymes Check lipid profile and CMP

## 2024-09-03 NOTE — Assessment & Plan Note (Signed)
 Orthostatic vitals negative today Since he takes losartan , spironolactone  and Lasix , advised to maintain adequate hydration Avoid sudden positional changes Some of his symptoms are concerning for vertigo as well-meclizine as needed for dizziness Simple vestibular exercises recommended, video material shown

## 2024-09-04 ENCOUNTER — Ambulatory Visit: Payer: Self-pay | Admitting: Internal Medicine

## 2024-09-04 ENCOUNTER — Other Ambulatory Visit: Payer: Self-pay | Admitting: Internal Medicine

## 2024-09-04 ENCOUNTER — Encounter (INDEPENDENT_AMBULATORY_CARE_PROVIDER_SITE_OTHER): Payer: Self-pay | Admitting: *Deleted

## 2024-09-04 DIAGNOSIS — R7401 Elevation of levels of liver transaminase levels: Secondary | ICD-10-CM

## 2024-09-04 LAB — CMP14+EGFR
ALT: 100 IU/L — ABNORMAL HIGH (ref 0–44)
AST: 46 IU/L — ABNORMAL HIGH (ref 0–40)
Albumin: 5.2 g/dL — ABNORMAL HIGH (ref 4.1–5.1)
Alkaline Phosphatase: 106 IU/L (ref 47–123)
BUN/Creatinine Ratio: 11 (ref 9–20)
BUN: 14 mg/dL (ref 6–20)
Bilirubin Total: 0.4 mg/dL (ref 0.0–1.2)
CO2: 23 mmol/L (ref 20–29)
Calcium: 10.1 mg/dL (ref 8.7–10.2)
Chloride: 100 mmol/L (ref 96–106)
Creatinine, Ser: 1.25 mg/dL (ref 0.76–1.27)
Globulin, Total: 2.5 g/dL (ref 1.5–4.5)
Glucose: 102 mg/dL — ABNORMAL HIGH (ref 70–99)
Potassium: 4.2 mmol/L (ref 3.5–5.2)
Sodium: 139 mmol/L (ref 134–144)
Total Protein: 7.7 g/dL (ref 6.0–8.5)
eGFR: 77 mL/min/1.73 (ref >59)

## 2024-09-04 LAB — LIPID PANEL
Chol/HDL Ratio: 8.3 ratio — ABNORMAL HIGH (ref 0.0–5.0)
Cholesterol, Total: 267 mg/dL — ABNORMAL HIGH (ref 100–199)
HDL: 32 mg/dL — ABNORMAL LOW (ref >39)
LDL Chol Calc (NIH): 189 mg/dL — ABNORMAL HIGH (ref 0–99)
Triglycerides: 238 mg/dL — ABNORMAL HIGH (ref 0–149)
VLDL Cholesterol Cal: 46 mg/dL — ABNORMAL HIGH (ref 5–40)

## 2024-09-12 ENCOUNTER — Ambulatory Visit (INDEPENDENT_AMBULATORY_CARE_PROVIDER_SITE_OTHER)

## 2024-09-12 ENCOUNTER — Ambulatory Visit
Admission: EM | Admit: 2024-09-12 | Discharge: 2024-09-12 | Disposition: A | Discharge status: Home / Self Care | Attending: Nurse Practitioner | Admitting: Nurse Practitioner

## 2024-09-12 ENCOUNTER — Other Ambulatory Visit: Payer: Self-pay

## 2024-09-12 ENCOUNTER — Encounter: Payer: Self-pay | Admitting: Emergency Medicine

## 2024-09-12 DIAGNOSIS — M25542 Pain in joints of left hand: Secondary | ICD-10-CM

## 2024-09-12 MED ORDER — PREDNISONE 20 MG PO TABS: 40.0000 mg | ORAL_TABLET | Freq: Every day | ORAL | 0 refills | Status: AC

## 2024-09-12 NOTE — Discharge Instructions (Addendum)
 The x-rays are negative for fracture or dislocation.  Your symptoms are consistent with possible inflammation of the tendons in your left index finger. Take medication as prescribed. A splint has been provided to allow for additional compression and support.  Wear the splint when you are engaged in prolonged or strenuous activity.  Do not wear the splint all of the time, as you will need to remove the splint and provide range of motion exercises to the left index finger to keep the joint mobile. You may apply ice to the affected area to help with pain or swelling.  Apply for 20 minutes, remove for 1 hour, repeat as needed. I have provided some exercises for you to perform at least 2-3 times daily. If your symptoms fail to improve over the next 2 to 4 weeks, or appear to worsen, recommend follow-up with orthopedics for further evaluation. Follow-up as needed.

## 2024-09-12 NOTE — ED Triage Notes (Signed)
 Pt reports left index finger feels like its jammed x3 weeks. Pt reports denies any known injury but reports has went to use left hand and reports increase discomfort in left index finger.

## 2024-09-12 NOTE — ED Provider Notes (Signed)
 [facility]    CSN: [id number] Arrival date & time: 09/12/24  0817      History   Chief Complaint Chief Complaint  Patient presents with   Finger Injury    HPI [name] is a 33 y.o. male.   The history is provided by the patient.   Patient presents for complaints of pain in the left index finger.  Patient states symptoms started approximately 2 to 3 weeks ago.  He states at that time, he could not recall any specific injury or trauma.  He endorses stiffness and a locking feeling in the left index finger.  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  Patient states earlier in the week, he was on his job and somehow hyperextended the left index finger.  He states since that time, he has had increased pain and swelling.  He states that the pain does sometimes radiate down into the joint in his left hand.  He denies bruising, numbness, or tingling.  He states that he is unable to bend the finger or grip items using the finger.  Patient states that he has not taken any medications for his symptoms.  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   Prediabetes 07/15/2024   CAD s/p stent placement 03/13/2024   Acute non-recurrent maxillary sinusitis 03/13/2024   GAD (generalized anxiety disorder) 03/13/2024   Chronic heart failure with preserved ejection fraction (HCC) 03/13/2024   Morbid obesity (HCC) 03/13/2024   Bilateral carpal tunnel syndrome 03/13/2024   Hyperlipidemia 08/06/2023   Essential hypertension  08/05/2023    Past Surgical History:  Procedure Laterality Date   CORONARY STENT INTERVENTION N/A 08/06/2023   Procedure: CORONARY STENT INTERVENTION;  Surgeon: Ladona Heinz, MD;  Location: [facility];  Service: Cardiovascular;  Laterality: N/A;   LEFT HEART CATH AND CORONARY ANGIOGRAPHY N/A 08/06/2023   Procedure: LEFT HEART CATH AND CORONARY ANGIOGRAPHY;  Surgeon: Ladona Heinz, MD;  Location: [facility];  Service: Cardiovascular;  Laterality: N/A;       Home Medications    Prior to Admission medications   Medication Sig Start Date End Date Taking? Authorizing Provider  amLODipine  (NORVASC ) 10 MG tablet Take 1 tablet (10 mg total) by mouth daily. 06/30/24   Miriam Norris, NP  aspirin  EC 81 MG tablet Take 1 tablet (81 mg total) by mouth daily. Swallow whole. 06/30/24   Miriam Norris, NP  atorvastatin  (LIPITOR) 80 MG tablet Take 1 tablet (80 mg total) by mouth daily. Patient not taking: Reported on 09/03/2024 06/30/24   Miriam Norris, NP  Blood Pressure Monitor DEVI 1 each by Does not apply route daily. Adult size large electric omron cuff 08/28/23   Miriam Norris, NP  clopidogrel  (PLAVIX ) 75 MG tablet Take 1 tablet (75 mg total) by mouth daily. 06/30/24   Miriam Norris, NP  DULoxetine  (CYMBALTA ) 30 MG capsule Take 1 capsule (30 mg total) by mouth 2 (two) times daily. 09/03/24   [name], MD  fluticasone  (FLONASE ) 50 MCG/ACT nasal spray Place  2 sprays into both nostrils daily. 03/13/24   Tobie Suzzane POUR, MD  furosemide  (LASIX ) 40 MG tablet Take 1 tablet (40 mg total) by mouth daily. 06/30/24   Miriam Norris, NP  losartan  (COZAAR ) 100 MG tablet Take 1 tablet (100 mg total) by mouth daily. 06/30/24   Miriam Norris, NP  meclizine  (ANTIVERT ) 25 MG tablet Take 1 tablet (25 mg total) by mouth 3 (three) times daily as needed for dizziness. 09/03/24   Tobie Suzzane POUR, MD  nitroGLYCERIN  (NITROSTAT ) 0.4 MG SL tablet Place 0.4 mg under the tongue every 5 (five) minutes x 3 doses  as needed for chest pain (if no relief after 2nd dose, proceed to ED or call 911).    [provider]  pantoprazole  (PROTONIX ) 40 MG tablet Take 1 tablet (40 mg total) by mouth daily. 06/30/24   Miriam Norris, NP  spironolactone  (ALDACTONE ) 25 MG tablet Take 1 tablet (25 mg total) by mouth daily. 06/30/24   Miriam Norris, NP    Family History Family History  Problem Relation Age of Onset   Heart attack Paternal Grandfather 57       CABG    Social History Social History   Tobacco Use   Smoking status: Former    Current packs/day: 0.50    Average packs/day: 0.5 packs/day for 21.5 years (10.7 ttl pk-yrs)    Types: Cigarettes    Start date: 03/21/2003   Smokeless tobacco: Never  Vaping Use   Vaping status: Every Day   Substances: Nicotine  Substance Use Topics   Alcohol use: Not Currently    Comment: occ   Drug use: Yes    Types: Marijuana     Allergies   Patient has no known allergies.   Review of Systems Review of Systems Per HPI  Physical Exam Triage Vital Signs ED Triage Vitals  Encounter Vitals Group     BP 09/12/24 0832 128/87     Girls Systolic BP Percentile --      Girls Diastolic BP Percentile --      Boys Systolic BP Percentile --      Boys Diastolic BP Percentile --      Pulse Rate 09/12/24 0832 92     Resp 09/12/24 0832 20     Temp 09/12/24 0832 97.6 F (36.4 C)     Temp Source 09/12/24 0832 Oral     SpO2 09/12/24 0832 95 %     Weight --      Height --      Head Circumference --      Peak Flow --      Pain Score 09/12/24 0831 0     Pain Loc --      Pain Education --      Exclude from Growth Chart --    No data found.  Updated Vital Signs BP 128/87 (BP Location: Right Arm)   Pulse 92   Temp 97.6 F (36.4 C) (Oral)   Resp 20   SpO2 95%   Visual Acuity Right Eye Distance:   Left Eye Distance:   Bilateral Distance:    Right Eye Near:   Left Eye Near:    Bilateral Near:     Physical Exam Vitals and nursing note reviewed.   Constitutional:      General: He is not in acute distress.    Appearance: Normal appearance.  HENT:     Head: Normocephalic.  Eyes:     Extraocular Movements: Extraocular movements intact.  Pupils: Pupils are equal, round, and reactive to light.  Pulmonary:     Effort: Pulmonary effort is normal.  Musculoskeletal:     Left hand: Swelling (generalized swelling left index finger) and tenderness (metacarpal joint of the index finger) present. No deformity. Decreased range of motion. Decreased strength. Normal capillary refill. Normal pulse.     Cervical back: Normal range of motion.     Comments: Cap refill of left index finger less than 2 seconds, neurovascularly intact.  Skin:    General: Skin is warm and dry.  Neurological:     General: No focal deficit present.     Mental Status: He is alert and oriented to person, place, and time.  Psychiatric:        Mood and Affect: Mood normal.        Behavior: Behavior normal.      UC Treatments / Results  Labs (all labs ordered are listed, but only abnormal results are displayed) Labs Reviewed - No data to display  EKG   Radiology DG Hand Complete Left Result Date: 09/12/2024 EXAM: 3 OR MORE VIEW(S) XRAY OF THE LEFT HAND 09/12/2024 08:46:14 AM COMPARISON: None available. CLINICAL HISTORY: Left index finger/swelling and pain x3 weeks. FINDINGS: BONES AND JOINTS: No acute fracture. No focal osseous lesion. No joint dislocation. SOFT TISSUES: The soft tissues are unremarkable. IMPRESSION: No acute osseous abnormality. Electronically signed by: Lynwood Seip MD 09/12/2024 08:50 AM EST RP Workstation: [id number]    Procedures Procedures (including critical care time)  Medications Ordered in UC Medications - No data to display  Initial Impression / Assessment and Plan / UC Course  I have reviewed the triage vital signs and the nursing notes.  Pertinent labs & imaging results that were available during my care of the patient were  reviewed by me and considered in my medical decision making (see chart for details).  X-ray is negative for fracture or dislocation.  On exam, the patient does exhibit swelling to the left index finger.  Differential diagnoses include tenosynovitis vs sprain at this time given the patient's symptoms and complaints of a locking feeling of the left index finger.  Will treat with prednisone  40 mg, along with placing a splint to the left index finger for compression and support.  Supportive care recommendations were provided and discussed with the patient to include gentle range of motion of the left index finger, and to monitor for worsening symptoms.  Patient was given information for orthopedics if symptoms fail to improve over the next 2 to 4 weeks.  Patient was in agreement with this plan of care and verbalizes understanding.  All questions were answered.  Patient stable for discharge.  Final Clinical Impressions(s) / UC Diagnoses   Final diagnoses:  None   Discharge Instructions   None    ED Prescriptions   None    PDMP not reviewed this encounter.   Gilmer Etta PARAS, NP 09/12/24 [phone]

## 2024-09-16 ENCOUNTER — Emergency Department (HOSPITAL_COMMUNITY)

## 2024-09-16 ENCOUNTER — Encounter (HOSPITAL_COMMUNITY): Payer: Self-pay | Admitting: *Deleted

## 2024-09-16 ENCOUNTER — Ambulatory Visit: Payer: Self-pay | Admitting: Internal Medicine

## 2024-09-16 ENCOUNTER — Ambulatory Visit (HOSPITAL_COMMUNITY)
Admission: RE | Admit: 2024-09-16 | Discharge: 2024-09-16 | Disposition: A | Discharge status: Home / Self Care | Source: Ambulatory Visit | Attending: Internal Medicine | Admitting: Internal Medicine

## 2024-09-16 ENCOUNTER — Emergency Department (HOSPITAL_COMMUNITY)
Admission: EM | Admit: 2024-09-16 | Discharge: 2024-09-16 | Disposition: A | Discharge status: Home / Self Care | Attending: Emergency Medicine | Admitting: Emergency Medicine

## 2024-09-16 ENCOUNTER — Other Ambulatory Visit: Payer: Self-pay

## 2024-09-16 DIAGNOSIS — R1011 Right upper quadrant pain: Secondary | ICD-10-CM | POA: Diagnosis not present

## 2024-09-16 DIAGNOSIS — Z79899 Other long term (current) drug therapy: Secondary | ICD-10-CM | POA: Diagnosis not present

## 2024-09-16 DIAGNOSIS — R748 Abnormal levels of other serum enzymes: Secondary | ICD-10-CM

## 2024-09-16 DIAGNOSIS — I2510 Atherosclerotic heart disease of native coronary artery without angina pectoris: Secondary | ICD-10-CM | POA: Insufficient documentation

## 2024-09-16 DIAGNOSIS — Z7982 Long term (current) use of aspirin: Secondary | ICD-10-CM | POA: Diagnosis not present

## 2024-09-16 DIAGNOSIS — R7401 Elevation of levels of liver transaminase levels: Secondary | ICD-10-CM | POA: Diagnosis present

## 2024-09-16 DIAGNOSIS — K760 Fatty (change of) liver, not elsewhere classified: Secondary | ICD-10-CM

## 2024-09-16 DIAGNOSIS — R0789 Other chest pain: Secondary | ICD-10-CM

## 2024-09-16 DIAGNOSIS — Z7902 Long term (current) use of antithrombotics/antiplatelets: Secondary | ICD-10-CM | POA: Diagnosis not present

## 2024-09-16 DIAGNOSIS — R072 Precordial pain: Secondary | ICD-10-CM | POA: Diagnosis present

## 2024-09-16 LAB — BASIC METABOLIC PANEL WITH GFR
Anion gap: 14 (ref 5–15)
BUN: 15 mg/dL (ref 6–20)
CO2: 22 mmol/L (ref 22–32)
Calcium: 9.6 mg/dL (ref 8.9–10.3)
Chloride: 102 mmol/L (ref 98–111)
Creatinine, Ser: 0.99 mg/dL (ref 0.61–1.24)
GFR, Estimated: >60 mL/min (ref >60)
Glucose, Bld: 111 mg/dL — ABNORMAL HIGH (ref 70–99)
Potassium: 3.9 mmol/L (ref 3.5–5.1)
Sodium: 138 mmol/L (ref 135–145)

## 2024-09-16 LAB — CBC
HCT: 47.6 % (ref 39.0–52.0)
Hemoglobin: 16.6 g/dL (ref 13.0–17.0)
MCH: 32.8 pg (ref 26.0–34.0)
MCHC: 34.9 g/dL (ref 30.0–36.0)
MCV: 94.1 fL (ref 80.0–100.0)
Platelets: 305 K/uL (ref 150–400)
RBC: 5.06 MIL/uL (ref 4.22–5.81)
RDW: 13.3 % (ref 11.5–15.5)
WBC: 6.1 K/uL (ref 4.0–10.5)
nRBC: 0 % (ref 0.0–0.2)

## 2024-09-16 LAB — CK: Total CK: 1019 U/L — ABNORMAL HIGH (ref 49–397)

## 2024-09-16 LAB — HEPATIC FUNCTION PANEL
ALT: 179 U/L — ABNORMAL HIGH (ref 0–44)
AST: 87 U/L — ABNORMAL HIGH (ref 15–41)
Albumin: 5.4 g/dL — ABNORMAL HIGH (ref 3.5–5.0)
Alkaline Phosphatase: 115 U/L (ref 38–126)
Bilirubin, Direct: 0.3 mg/dL — ABNORMAL HIGH (ref 0.0–0.2)
Indirect Bilirubin: 0.4 mg/dL (ref 0.3–0.9)
Total Bilirubin: 0.6 mg/dL (ref 0.0–1.2)
Total Protein: 8.3 g/dL — ABNORMAL HIGH (ref 6.5–8.1)

## 2024-09-16 LAB — TROPONIN T, HIGH SENSITIVITY
Troponin T High Sensitivity: <15 ng/L (ref 0–19)
Troponin T High Sensitivity: <15 ng/L (ref 0–19)

## 2024-09-16 LAB — LIPASE, BLOOD: Lipase: 41 U/L (ref 11–51)

## 2024-09-16 MED ORDER — SODIUM CHLORIDE 0.9 % IV BOLUS
1000.0000 mL | Freq: Once | INTRAVENOUS | Status: AC
  Administered 2024-09-16: 1000 mL via INTRAVENOUS

## 2024-09-16 NOTE — ED Provider Notes (Signed)
 [facility] AT [facility] Provider Note   CSN: [id number] Arrival date & time: 09/16/24  9048     Patient presents with: Chest Pain   [name] is a 33 y.o. male.   Patient is a 33 year old male with a past medical history of CAD and previous MI who presents emergency department with a chief complaint of a bout of substernal chest pain yesterday morning.  Patient notes that the pain only lasted for short period of time and then completely resolved.  He described it as a pressure sensation in his chest.  He notes he has had no repeat chest pain since that time.  Patient is completely asymptomatic at this point.  He has had some intermittent pain in his abdomen and did have an outpatient ultrasound performed this morning which demonstrated fatty liver disease with no other acute process.  He denies any associated shortness of breath.  He has had no dizziness, lightheadedness or syncope.  He notes he did recently start riding dirt bikes again and is unsure if this may be playing a role.  He denies any associated dizziness, lightheadedness or syncope.   Chest Pain      Prior to Admission medications   Medication Sig Start Date End Date Taking? Authorizing Provider  amLODipine  (NORVASC ) 10 MG tablet Take 1 tablet (10 mg total) by mouth daily. 06/30/24   Miriam Norris, NP  aspirin  EC 81 MG tablet Take 1 tablet (81 mg total) by mouth daily. Swallow whole. 06/30/24   Miriam Norris, NP  atorvastatin  (LIPITOR) 80 MG tablet Take 1 tablet (80 mg total) by mouth daily. Patient not taking: Reported on 09/03/2024 06/30/24   Miriam Norris, NP  Blood Pressure Monitor DEVI 1 each by Does not apply route daily. Adult size large electric omron cuff 08/28/23   Miriam Norris, NP  clopidogrel  (PLAVIX ) 75 MG tablet Take 1 tablet (75 mg total) by mouth daily. 06/30/24   Miriam Norris, NP  DULoxetine  (CYMBALTA ) 30 MG capsule Take 1 capsule (30 mg total) by mouth 2 (two) times  daily. 09/03/24   [name], MD  fluticasone  (FLONASE ) 50 MCG/ACT nasal spray Place 2 sprays into both nostrils daily. 03/13/24   [name], MD  furosemide  (LASIX ) 40 MG tablet Take 1 tablet (40 mg total) by mouth daily. 06/30/24   Miriam Norris, NP  losartan  (COZAAR ) 100 MG tablet Take 1 tablet (100 mg total) by mouth daily. 06/30/24   Miriam Norris, NP  meclizine  (ANTIVERT ) 25 MG tablet Take 1 tablet (25 mg total) by mouth 3 (three) times daily as needed for dizziness. 09/03/24   [name], MD  nitroGLYCERIN  (NITROSTAT ) 0.4 MG SL tablet Place 0.4 mg under the tongue every 5 (five) minutes x 3 doses as needed for chest pain (if no relief after 2nd dose, proceed to ED or call 911).    [provider]  pantoprazole  (PROTONIX ) 40 MG tablet Take 1 tablet (40 mg total) by mouth daily. 06/30/24   Miriam Norris, NP  predniSONE  (DELTASONE ) 20 MG tablet Take 2 tablets (40 mg total) by mouth daily with breakfast for 5 days. 09/12/24 09/17/24  Leath-Warren, Etta PARAS, NP  spironolactone  (ALDACTONE ) 25 MG tablet Take 1 tablet (25 mg total) by mouth daily. 06/30/24   Miriam Norris, NP    Allergies: Patient has no known allergies.    Review of Systems  Cardiovascular:  Positive for chest pain.  All other systems reviewed and are negative.  Updated Vital Signs BP (!) 154/112   Pulse 89   Temp 98.3 F (36.8 C) (Oral)   Resp 20   Ht 5' 11 (1.803 m)   Wt 123.2 kg   SpO2 100%   BMI 37.88 kg/m   Physical Exam Vitals and nursing note reviewed.  Constitutional:      General: He is not in acute distress.    Appearance: Normal appearance. He is not ill-appearing.  HENT:     Head: Normocephalic and atraumatic.     Nose: Nose normal.     Mouth/Throat:     Mouth: Mucous membranes are moist.  Eyes:     Extraocular Movements: Extraocular movements intact.     Conjunctiva/sclera: Conjunctivae normal.     Pupils: Pupils are equal, round, and reactive to light.   Cardiovascular:     Rate and Rhythm: Normal rate and regular rhythm.     Pulses: Normal pulses.     Heart sounds: Normal heart sounds. Heart sounds not distant. No murmur heard. Pulmonary:     Effort: Pulmonary effort is normal. No tachypnea.     Breath sounds: Normal breath sounds. No decreased breath sounds, wheezing, rhonchi or rales.  Chest:     Chest wall: No tenderness.  Abdominal:     General: Abdomen is flat. Bowel sounds are normal.     Palpations: Abdomen is soft. There is no mass.     Tenderness: There is no abdominal tenderness. There is no guarding.  Musculoskeletal:        General: Normal range of motion.     Cervical back: Normal range of motion and neck supple.  Skin:    General: Skin is warm and dry.  Neurological:     General: No focal deficit present.     Mental Status: He is alert and oriented to person, place, and time. Mental status is at baseline.  Psychiatric:        Mood and Affect: Mood normal.        Behavior: Behavior normal.        Thought Content: Thought content normal.        Judgment: Judgment normal.     (all labs ordered are listed, but only abnormal results are displayed) Labs Reviewed  BASIC METABOLIC PANEL WITH GFR - Abnormal; Notable for the following components:      Result Value   Glucose, Bld 111 (*)    All other components within normal limits  CK - Abnormal; Notable for the following components:   Total CK 1,019 (*)    All other components within normal limits  HEPATIC FUNCTION PANEL - Abnormal; Notable for the following components:   Total Protein 8.3 (*)    Albumin 5.4 (*)    AST 87 (*)    ALT 179 (*)    Bilirubin, Direct 0.3 (*)    All other components within normal limits  CBC  LIPASE, BLOOD  TROPONIN T, HIGH SENSITIVITY  TROPONIN T, HIGH SENSITIVITY    EKG: None  Radiology: US  Abdomen Limited RUQ (LIVER/GB) Result Date: 09/16/2024 CLINICAL DATA:  Elevated ALT/AST EXAM: ULTRASOUND ABDOMEN LIMITED RIGHT UPPER  QUADRANT COMPARISON:  08/05/2023 FINDINGS: Gallbladder: No gallstones. No wall thickening or pericholecystic fluid. No sonographic Murphy's sign noted by sonographer. Common bile duct: Diameter: 3 mm Liver: Increased echogenicity. Focal fatty sparing along the gallbladder fossa. No intrahepatic biliary ductal dilation. Portal vein is patent on color Doppler imaging with normal direction of blood flow towards the liver. Right  Kidney: Partially visualized. No mass. No hydronephrosis or nephrolithiasis. Other: None. IMPRESSION: 1. No cholecystolithiasis or changes of acute cholecystitis. 2. Hepatic steatosis. Electronically Signed   By: Rogelia Myers M.D.   On: 09/16/2024 10:43   DG Chest 2 View Result Date: 09/16/2024 CLINICAL DATA:  chest pain EXAM: DG CHEST 2V COMPARISON:  None available. FINDINGS: No focal airspace consolidation, pleural effusion, or pneumothorax. No cardiomegaly.No acute fracture or destructive lesion. Minimal dextrocurvature of the thoracic spine. Multilevel degenerative disc disease of the spine. IMPRESSION: No acute cardiopulmonary abnormality. Electronically Signed   By: Rogelia Myers M.D.   On: 09/16/2024 10:38     Procedures   Medications Ordered in the ED  sodium chloride  0.9 % bolus 1,000 mL (1,000 mLs Intravenous New Bag/Given 09/16/24 1144)                                    Medical Decision Making Amount and/or Complexity of Data Reviewed Labs: ordered. Radiology: ordered.   This patient presents to the ED for concern of chest pain differential diagnosis includes ACS, pulmonary embolus, pericarditis, myocarditis, endocarditis, aortic aneurysm or dissection, pneumonia, pneumothorax, hemothorax, costochondritis, chest wall pain    Additional history obtained:  Additional history obtained from medical records External records from outside source obtained and reviewed including medical records   Lab Tests:  I Ordered, and personally interpreted labs.   The pertinent results include: No leukocytosis, no anemia, normal kidney function, normal electrolytes, mild elevation of liver function but consistent with baseline, negative lipase, negative serial troponins, elevated CK   Imaging Studies ordered:  I ordered imaging studies including chest x-ray I independently visualized and interpreted imaging which showed no acute cardiopulmonary process I agree with the radiologist interpretation   Medicines ordered and prescription drug management:  I ordered medication including IV fluids for elevated CK Reevaluation of the patient after these medicines showed that the patient improved I have reviewed the patients home medicines and have made adjustments as needed   Problem List / ED Course:  Patient is doing well at this time and is stable for discharge home.  Discussed with patient all workup in the emergency department has been unremarkable.  Do not suspect acute ACS at this time.  Patient has an EKG with no acute ischemic changes and negative serial troponins.  Low suspicion for pulmonary embolus in this patient and do not suspect aortic aneurysm or dissection.  Chest x-ray demonstrated no indication for pneumonia, pneumothorax, hemothorax.  Did review his outpatient right upper quadrant ultrasound which demonstrated no indication for acute cholecystitis with only fatty liver disease.  He will follow-up with his primary care doctor regarding this.  Did offer patient additional imaging on the abdomen today to include CT scan but he has deferred at this point.  Abdominal exam is otherwise benign with no focal tenderness at this time.  Do not suspect an acute intra-abdominal surgical process.  He did have a mild elevation of his CK and was given IV fluids for this and he will continue oral hydration.  Do not suspect that admission is warranted at this time.  Patient already has close follow-up with his cardiologist within the next week.  Strict  precautions were provided for any new or worsening symptoms.  Patient voiced understanding and had no additional questions.   Social Determinants of Health:  None        Final diagnoses:  None    ED Discharge Orders     None          Daralene Lonni BIRCH, PA-C 09/16/24 1315    Elnor Savant A, DO 09/18/24 [phone]

## 2024-09-16 NOTE — ED Triage Notes (Signed)
 Pt c/o chest pain that started yesterday am  Pt states the pain is in the center of his chest and is currently not having pain but states when the pain occurs it is crushing pain

## 2024-09-16 NOTE — Discharge Instructions (Signed)
 Please follow-up closely with your cardiologist on an outpatient basis as scheduled.  Return to emergency department immediately for any new or worsening symptoms.

## 2024-09-23 ENCOUNTER — Ambulatory Visit: Admitting: Nurse Practitioner

## 2024-09-30 ENCOUNTER — Ambulatory Visit (INDEPENDENT_AMBULATORY_CARE_PROVIDER_SITE_OTHER): Admitting: Gastroenterology

## 2024-10-01 ENCOUNTER — Encounter (INDEPENDENT_AMBULATORY_CARE_PROVIDER_SITE_OTHER): Payer: Self-pay | Admitting: Gastroenterology

## 2024-10-17 ENCOUNTER — Ambulatory Visit: Admitting: Internal Medicine

## 2024-10-28 ENCOUNTER — Ambulatory Visit: Admitting: Nurse Practitioner

## 2024-11-12 ENCOUNTER — Ambulatory Visit: Attending: Cardiology

## 2024-11-12 ENCOUNTER — Telehealth: Payer: Self-pay

## 2024-11-12 DIAGNOSIS — E782 Mixed hyperlipidemia: Secondary | ICD-10-CM

## 2024-11-12 NOTE — Assessment & Plan Note (Signed)
 Assessment:  LDL goal: < 70 mg/dl; last LDLc 810  mg/dl (88/7974); Trig goal: < 150; last Trig 238 on no therapy (during statin hold) Most recent liver enzymes (08/2024): AST 87, ALT 179, Alk phos; CK (08/2024) 1019 PCP referred to GI and ordered abdominal US  which revealed fatty liver but no acute changes Discussed next potential therapy options, including PCSK9 inhibitors. Reviewed that this medication class is appropriate given elevated liver enzymes and the need to avoid statins and ezetimibe at this time. We reviewed cost, dosing efficacy, side effects, and administration technique  Encouraged patient to consume heart healthy diet and engage in aerobic exercise such as walking or jogging  Plan: Do not resume atorvastatin  Will apply for PA for PCSK9i; will inform patient upon approval  Lipid lab and hepatic panel due in 3 months after starting PCSK9i

## 2024-11-12 NOTE — Progress Notes (Signed)
 Patient ID: [name]                 DOB: 07/28/1991                    MRN: [id number]      HPI: [name] is a 34 y.o. male patient referred to lipid clinic by Trevor Crate, NP. PMH is significant for HTN, CAD s/p STEMI, HFpEF, HLD, hx of tobacco abuse, alcohol abuse, possible marijuana use and obesity.   The patient was last seen by cardiology provider in August 2025 for a follow-up visit. At that time, his LDL was 82 mg/dL, and he was advised to continue atorvastatin . A CMP drawn in September showed AST 46 and ALT 74, so he was instructed to hold atorvastatin  for 30 days and repeat liver function tests. A follow-up lipid panel in November showed a significantly elevated LDL of 189 mg/dL and persistent liver enzyme abnormalities (AST 100, ALT 46) and told to continue holding statin. His PCP is monitoring the elevated liver enzymes, had ordered an abdominal ultrasound, and referred him to GI. US  showed fatty liver changes, but no acute changes. He was told to continue holding statin. The patient has now been referred to the Pharm[facility] lipid clinic for further management.  Patient presents today in good spirits. He is not taking any cholesterol lowering medication and has been off statins for at least two months now. He reports feeling fine overall other than some presumed indigestion that he doesn't think is improving with PPI. He reports that he will follow up with PCP.   Reviewed available options for lowering LDL cholesterol, including PCSK9 inhibitors. Discussed how statins/ ezetimibe can affect liver enzymes and why we aim to avoid statins and ezetimibe at this time given h elevated liver function tests  Discussed mechanisms of action, dosing, side effects and potential decreases in LDL cholesterol.  Also reviewed cost information and potential options for patient assistance.   Current Medications: none  Intolerances: atorvastatin  80 mg daily (elevated liver enzymes) Risk Factors: HTN,  CAD s/p STEMI, HFpEF, hx of tobacco abuse LDL goal: < 70 Lipid panel (08/2024): Chol 267, Trig 238, HDL 32, LDL 189 Liver enzymes (08/2024): AST 87, ALT 179, Alk phos CK (08/2024): 1019  Diet: Patient skips breakfast and lunch typically; Dinner is usually spaghetti and baked chicken and vegetables. No fried foods.   Exercise:  Rides dirt bikes  Family History:  Relation Problem Comments  Paternal Grandfather Heart attack (Age: 46) CABG      Social History:  Alcohol: couple drinks per week Smoking: denies cigarette use but currently vapes; not interested in weaning at this time   Labs:  Lipid Panel     Component Value Date/Time   CHOL 267 (H) 09/03/2024 0940   TRIG 238 (H) 09/03/2024 0940   HDL 32 (L) 09/03/2024 0940   CHOLHDL 8.3 (H) 09/03/2024 0940   CHOLHDL 6.0 08/06/2023 0504   VLDL 45 (H) 08/06/2023 0504   LDLCALC 189 (H) 09/03/2024 0940   LABVLDL 46 (H) 09/03/2024 0940    Past Medical History:  Diagnosis Date   HTN (hypertension)    Hyperlipidemia    MI (myocardial infarction) (HCC) 08/05/2023    Medications Ordered Prior to Encounter[1]  Allergies[2]  Assessment/Plan:  1. Hyperlipidemia -  Problem  Hyperlipidemia   Hyperlipidemia Assessment:  LDL goal: < 70 mg/dl; last LDLc 810  mg/dl (88/7974); Trig goal: < 150; last Trig 238 on no therapy (  during statin hold) Most recent liver enzymes (08/2024): AST 87, ALT 179, Alk phos; CK (08/2024) 1019 PCP referred to GI and ordered abdominal US  which revealed fatty liver but no acute changes Discussed next potential therapy options, including PCSK9 inhibitors. Reviewed that this medication class is appropriate given elevated liver enzymes and the need to avoid statins and ezetimibe at this time. We reviewed cost, dosing efficacy, side effects, and administration technique  Encouraged patient to consume heart healthy diet and engage in aerobic exercise such as walking or jogging  Plan: Do not resume  atorvastatin  Will apply for PA for PCSK9i; will inform patient upon approval  Lipid lab and hepatic panel due in 3 months after starting PCSK9i    Thank you,  [name], Pharm.D, CPP [place] [name]. [facility] [address], [place], [place] [address] Phone: [phone]; Fax: [phone]        [1]  Current Outpatient Medications on File Prior to Visit  Medication Sig Dispense Refill   amLODipine  (NORVASC ) 10 MG tablet Take 1 tablet (10 mg total) by mouth daily. 90 tablet 3   aspirin  EC 81 MG tablet Take 1 tablet (81 mg total) by mouth daily. Swallow whole. 90 tablet 3   atorvastatin  (LIPITOR) 80 MG tablet Take 1 tablet (80 mg total) by mouth daily. (Patient not taking: Reported on 09/03/2024) 90 tablet 3   Blood Pressure Monitor DEVI 1 each by Does not apply route daily. Adult size large electric omron cuff 1 each 0   clopidogrel  (PLAVIX ) 75 MG tablet Take 1 tablet (75 mg total) by mouth daily. 90 tablet 3   DULoxetine  (CYMBALTA ) 30 MG capsule Take 1 capsule (30 mg total) by mouth 2 (two) times daily. 60 capsule 3   fluticasone  (FLONASE ) 50 MCG/ACT nasal spray Place 2 sprays into both nostrils daily. 16 g 1   furosemide  (LASIX ) 40 MG tablet Take 1 tablet (40 mg total) by mouth daily. 90 tablet 3   losartan  (COZAAR ) 100 MG tablet Take 1 tablet (100 mg total) by mouth daily. 90 tablet 3   meclizine  (ANTIVERT ) 25 MG tablet Take 1 tablet (25 mg total) by mouth 3 (three) times daily as needed for dizziness. 30 tablet 0   nitroGLYCERIN  (NITROSTAT ) 0.4 MG SL tablet Place 0.4 mg under the tongue every 5 (five) minutes x 3 doses as needed for chest pain (if no relief after 2nd dose, proceed to ED or call 911).     pantoprazole  (PROTONIX ) 40 MG tablet Take 1 tablet (40 mg total) by mouth daily. 90 tablet 3   spironolactone  (ALDACTONE ) 25 MG tablet Take 1 tablet (25 mg total) by mouth daily. 90 tablet 3   No current facility-administered  medications on file prior to visit.  [2] No Known Allergies

## 2024-11-12 NOTE — Patient Instructions (Addendum)
 Your Results:             Your most recent labs Goal  Total Cholesterol 267 < 200  Triglycerides 238 < 150  HDL (happy/good cholesterol) 32 > 40  LDL (lousy/bad cholesterol 189 < 70   Medication changes: Do not resume atorvastatin  We will start the process to get Repatha  or Praluent covered by your insurance.  Once the prior authorization is complete, we will call you to let you know and confirm pharmacy information.    Lab orders:We want to repeat labs 3 months after starting PCSK9i.  We will send you a lab order to remind you       once we get closer to that time.    Tamsin Nader E. Jakayden Cancio, Pharm.D, CPP [place] [facility]. [facility] [address], [place], [place] [address] Phone: [phone]; Fax: [phone]     Praluent is a cholesterol medication that improved your body's ability to get rid of bad cholesterol known as LDL. It can lower your LDL up to 60%. It is an injection that is given under the skin every 2 weeks. The most common side effects of Praluent include runny nose, symptoms of the common cold, rarely flu or flu-like symptoms, back/muscle pain in about 3-4% of the patients, and redness, pain, or bruising at the injection site.    Repatha  is a cholesterol medication that improved your body's ability to get rid of bad cholesterol known as LDL. It can lower your LDL up to 60%! It is an injection that is given under the skin every 2 weeks. The medication often requires a prior authorization from your insurance company. We will take care of submitting all the necessary information to your insurance company to get it approved. The most common side effects of Repatha  include runny nose, symptoms of the common cold, rarely flu or flu-like symptoms, back/muscle pain in about 3-4% of the patients, and redness, pain, or bruising at the injection site.    It is also recommended that patients with high cholesterol adhere to a heart healthy diet,  get regular exercise, avoid use of tobacco products, and maintain a healthy weight. Steps that you can take to help in these areas:  Limit consumption of trans fats, saturated fats, and cholesterol in your diet  Increase intake of lean meats such as chicken, turkey, and fish  Increase intake of foods rich in fiber such as fresh fruits, vegetables, beans and oatmeal Exercise as you are able; even 30 minutes of walking daily can aid in increasing heart health

## 2024-11-13 ENCOUNTER — Other Ambulatory Visit (HOSPITAL_COMMUNITY): Payer: Self-pay

## 2024-11-13 ENCOUNTER — Telehealth: Payer: Self-pay | Admitting: Pharmacy Technician

## 2024-11-13 MED ORDER — REPATHA SURECLICK 140 MG/ML ~~LOC~~ SOAJ: 140.0000 mg | SUBCUTANEOUS | 3 refills | Status: AC

## 2024-11-13 NOTE — Telephone Encounter (Signed)
 Pharmacy Patient Advocate Encounter   Received notification from Physician's Office that prior authorization for Repatha  is required/requested.   Insurance verification completed.   The patient is insured through [employer].   Per test claim: The current 11/13/24 day co-pay is, $34.99- one month.  No PA needed at this time. This test claim was processed through [facility]- copay amounts may vary at other pharmacies due to pharmacy/plan contracts, or as the patient moves through the different stages of their insurance plan.

## 2024-11-13 NOTE — Addendum Note (Signed)
 Addended by: Man Effertz E on: 11/13/2024 10:53 AM   Modules accepted: Orders

## 2024-11-13 NOTE — Telephone Encounter (Signed)
 Called pt to discuss Repatha  copay. He would like to proceed with therapy. Confirmed pharmacy. Repatha  rx sent

## 2024-11-13 NOTE — Telephone Encounter (Signed)
 Please see other encounter.

## 2025-01-23 ENCOUNTER — Ambulatory Visit: Admitting: Nurse Practitioner
